# Patient Record
Sex: Male | Born: 1955
Health system: Southern US, Community
[De-identification: ages and names within clinical notes are randomized; demographics above are authoritative.]

## PROBLEM LIST (undated history)

## (undated) DIAGNOSIS — I341 Nonrheumatic mitral (valve) prolapse: Secondary | ICD-10-CM

## (undated) DIAGNOSIS — K648 Other hemorrhoids: Secondary | ICD-10-CM

## (undated) DIAGNOSIS — E7800 Pure hypercholesterolemia, unspecified: Secondary | ICD-10-CM

## (undated) DIAGNOSIS — G43909 Migraine, unspecified, not intractable, without status migrainosus: Secondary | ICD-10-CM

## (undated) DIAGNOSIS — I10 Essential (primary) hypertension: Secondary | ICD-10-CM

## (undated) DIAGNOSIS — G459 Transient cerebral ischemic attack, unspecified: Secondary | ICD-10-CM

## (undated) DIAGNOSIS — K449 Diaphragmatic hernia without obstruction or gangrene: Secondary | ICD-10-CM

## (undated) HISTORY — DX: Essential (primary) hypertension: I10

## (undated) HISTORY — DX: Pure hypercholesterolemia, unspecified: E78.00

## (undated) HISTORY — PX: COLONOSCOPY: SHX174

## (undated) HISTORY — DX: Other hemorrhoids: K64.8

## (undated) HISTORY — DX: Diaphragmatic hernia without obstruction or gangrene: K44.9

## (undated) HISTORY — DX: Nonrheumatic mitral (valve) prolapse: I34.1

## (undated) HISTORY — DX: Migraine, unspecified, not intractable, without status migrainosus: G43.909

---
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
## 2000-08-20 ENCOUNTER — Encounter: Admission: RE | Admit: 2000-08-20 | Discharge: 2000-08-20 | Payer: Self-pay | Admitting: Internal Medicine

## 2000-08-20 ENCOUNTER — Encounter: Payer: Self-pay | Admitting: Internal Medicine

## 2001-07-31 ENCOUNTER — Ambulatory Visit (HOSPITAL_COMMUNITY): Admission: RE | Admit: 2001-07-31 | Discharge: 2001-07-31 | Payer: Self-pay | Admitting: *Deleted

## 2001-07-31 ENCOUNTER — Encounter (INDEPENDENT_AMBULATORY_CARE_PROVIDER_SITE_OTHER): Payer: Self-pay | Admitting: Specialist

## 2007-06-16 ENCOUNTER — Ambulatory Visit (HOSPITAL_COMMUNITY): Admission: RE | Admit: 2007-06-16 | Discharge: 2007-06-16 | Payer: Self-pay | Admitting: *Deleted

## 2008-08-09 ENCOUNTER — Encounter: Payer: Self-pay | Admitting: Cardiovascular Disease

## 2010-02-04 DIAGNOSIS — G459 Transient cerebral ischemic attack, unspecified: Secondary | ICD-10-CM

## 2010-02-04 HISTORY — DX: Transient cerebral ischemic attack, unspecified: G45.9

## 2010-02-11 ENCOUNTER — Inpatient Hospital Stay (HOSPITAL_COMMUNITY)
Admission: AD | Admit: 2010-02-11 | Discharge: 2010-02-13 | Payer: Self-pay | Source: Home / Self Care | Attending: Internal Medicine | Admitting: Internal Medicine

## 2010-02-11 ENCOUNTER — Emergency Department (HOSPITAL_BASED_OUTPATIENT_CLINIC_OR_DEPARTMENT_OTHER)
Admission: EM | Admit: 2010-02-11 | Discharge: 2010-02-11 | Disposition: A | Discharge status: Other Acute Inpatient Hospital | Payer: Self-pay | Source: Home / Self Care | Admitting: Emergency Medicine

## 2010-02-19 LAB — DIFFERENTIAL
Basophils Absolute: 0 10*3/uL (ref 0.0–0.1)
Basophils Relative: 0 % (ref 0–1)
Eosinophils Absolute: 0.1 10*3/uL (ref 0.0–0.7)
Eosinophils Relative: 1 % (ref 0–5)
Lymphocytes Relative: 26 % (ref 12–46)
Lymphs Abs: 1.5 10*3/uL (ref 0.7–4.0)
Monocytes Absolute: 0.4 10*3/uL (ref 0.1–1.0)
Monocytes Relative: 7 % (ref 3–12)
Neutro Abs: 3.8 10*3/uL (ref 1.7–7.7)
Neutrophils Relative %: 65 % (ref 43–77)

## 2010-02-19 LAB — BASIC METABOLIC PANEL
BUN: 14 mg/dL (ref 6–23)
CO2: 25 mEq/L (ref 19–32)
Calcium: 9.1 mg/dL (ref 8.4–10.5)
Chloride: 110 mEq/L (ref 96–112)
Creatinine, Ser: 1 mg/dL (ref 0.4–1.5)
GFR calc Af Amer: >60 mL/min (ref >60)
GFR calc non Af Amer: >60 mL/min (ref >60)
Glucose, Bld: 91 mg/dL (ref 70–99)
Potassium: 3.8 mEq/L (ref 3.5–5.1)
Sodium: 142 mEq/L (ref 135–145)

## 2010-02-19 LAB — COMPREHENSIVE METABOLIC PANEL
ALT: 35 U/L (ref 0–53)
AST: 41 U/L — ABNORMAL HIGH (ref 0–37)
Albumin: 4.5 g/dL (ref 3.5–5.2)
Alkaline Phosphatase: 93 U/L (ref 39–117)
BUN: 15 mg/dL (ref 6–23)
CO2: 26 mEq/L (ref 19–32)
Calcium: 9.7 mg/dL (ref 8.4–10.5)
Chloride: 107 mEq/L (ref 96–112)
Creatinine, Ser: 1 mg/dL (ref 0.4–1.5)
GFR calc Af Amer: >60 mL/min (ref >60)
GFR calc non Af Amer: >60 mL/min (ref >60)
Glucose, Bld: 93 mg/dL (ref 70–99)
Potassium: 4.4 mEq/L (ref 3.5–5.1)
Sodium: 146 mEq/L — ABNORMAL HIGH (ref 135–145)
Total Bilirubin: 0.8 mg/dL (ref 0.3–1.2)
Total Protein: 7.7 g/dL (ref 6.0–8.3)

## 2010-02-19 LAB — LIPID PANEL
Cholesterol: 189 mg/dL (ref 0–200)
HDL: 46 mg/dL (ref >39)
LDL Cholesterol: 127 mg/dL — ABNORMAL HIGH (ref 0–99)
Total CHOL/HDL Ratio: 4.1 RATIO
Triglycerides: 78 mg/dL (ref <150)
VLDL: 16 mg/dL (ref 0–40)

## 2010-02-19 LAB — ANTIPHOSPHOLIPID SYNDROME EVAL, BLD
Anticardiolipin IgA: 3 APL U/mL — ABNORMAL LOW (ref <22)
Anticardiolipin IgG: 3 GPL U/mL — ABNORMAL LOW (ref <23)
Anticardiolipin IgM: 0 MPL U/mL — ABNORMAL LOW (ref <11)
DRVVT: 39 secs (ref 36.2–44.3)
Lupus Anticoagulant: NOT DETECTED
PTT Lupus Anticoagulant: 33.3 secs (ref 30.0–45.6)
Phosphatydalserine, IgA: <20 U/mL (ref <20)
Phosphatydalserine, IgG: <10 U/mL (ref <10)
Phosphatydalserine, IgM: <25 U/mL (ref <25)

## 2010-02-19 LAB — ANA
Anti Nuclear Antibody(ANA): NEGATIVE
Anti Nuclear Antibody(ANA): NEGATIVE
Anti Nuclear Antibody(ANA): NEGATIVE

## 2010-02-19 LAB — TSH
TSH: 1.548 u[IU]/mL (ref 0.350–4.500)
TSH: 1.94 u[IU]/mL (ref 0.350–4.500)

## 2010-02-19 LAB — CARDIAC PANEL(CRET KIN+CKTOT+MB+TROPI)
CK, MB: 0.8 ng/mL (ref 0.3–4.0)
CK, MB: 0.6 ng/mL (ref 0.3–4.0)
Relative Index: INVALID (ref 0.0–2.5)
Relative Index: INVALID (ref 0.0–2.5)
Total CK: 53 U/L (ref 7–232)
Total CK: 48 U/L (ref 7–232)
Troponin I: 0.01 ng/mL (ref 0.00–0.06)
Troponin I: 0.01 ng/mL (ref 0.00–0.06)

## 2010-02-19 LAB — CBC
HCT: 47.2 % (ref 39.0–52.0)
Hemoglobin: 16.8 g/dL (ref 13.0–17.0)
MCH: 29.4 pg (ref 26.0–34.0)
MCHC: 35.6 g/dL (ref 30.0–36.0)
MCV: 82.7 fL (ref 78.0–100.0)
Platelets: 204 10*3/uL (ref 150–400)
RBC: 5.71 MIL/uL (ref 4.22–5.81)
RDW: 13.1 % (ref 11.5–15.5)
WBC: 5.8 10*3/uL (ref 4.0–10.5)

## 2010-02-19 LAB — ACETYLCHOLINE RECEPTOR, BINDING: Acetylcholine Receptor Ab: <0.3 nmol/L (ref <=0.30)

## 2010-02-19 LAB — C4 COMPLEMENT
Complement C4, Body Fluid: 23 mg/dL (ref 16–47)
Complement C4, Body Fluid: 24 mg/dL (ref 16–47)

## 2010-02-19 LAB — D-DIMER, QUANTITATIVE: D-Dimer, Quant: 0.22 ug/mL-FEU (ref 0.00–0.48)

## 2010-02-19 LAB — ANGIOTENSIN CONVERTING ENZYME: Angiotensin-Converting Enzyme: 51 U/L (ref 8–52)

## 2010-02-19 LAB — C3 COMPLEMENT
C3 Complement: 118 mg/dL (ref 88–201)
C3 Complement: 123 mg/dL (ref 88–201)

## 2010-02-19 LAB — C-REACTIVE PROTEIN: CRP: 0.3 mg/dL — ABNORMAL LOW (ref <0.6)

## 2010-02-19 LAB — B. BURGDORFI ANTIBODIES: B burgdorferi Ab IgG+IgM: 0.24 {ISR}

## 2010-02-19 LAB — COMPLEMENT, TOTAL: Compl, Total (CH50): 54 U/mL (ref 31–60)

## 2010-02-19 LAB — SEDIMENTATION RATE: Sed Rate: 1 mm/hr (ref 0–16)

## 2010-02-19 LAB — ANTI-DNA ANTIBODY, DOUBLE-STRANDED: ds DNA Ab: 7 IU/mL (ref <30)

## 2010-02-19 LAB — PROTIME-INR
INR: 0.96 (ref 0.00–1.49)
Prothrombin Time: 13 seconds (ref 11.6–15.2)

## 2010-02-19 LAB — STRIATED MUSCLE ANTIBODY: Striated Muscle Ab: <1:40 {titer}

## 2010-02-26 LAB — ANTI-SMOOTH MUSCLE ANTIBODY, IGG: F-Actin IgG: <20

## 2010-02-28 LAB — MISCELLANEOUS TEST

## 2010-04-16 ENCOUNTER — Encounter: Payer: Self-pay | Admitting: Cardiovascular Disease

## 2010-04-16 ENCOUNTER — Telehealth (INDEPENDENT_AMBULATORY_CARE_PROVIDER_SITE_OTHER): Payer: Self-pay | Admitting: *Deleted

## 2010-04-16 ENCOUNTER — Ambulatory Visit (INDEPENDENT_AMBULATORY_CARE_PROVIDER_SITE_OTHER): Payer: Self-pay | Admitting: Cardiovascular Disease

## 2010-04-16 DIAGNOSIS — Q211 Atrial septal defect: Secondary | ICD-10-CM | POA: Insufficient documentation

## 2010-04-19 ENCOUNTER — Ambulatory Visit (HOSPITAL_COMMUNITY)
Admission: RE | Admit: 2010-04-19 | Discharge: 2010-04-19 | Disposition: A | Discharge status: Home / Self Care | Payer: BC Managed Care – PPO | Source: Ambulatory Visit | Attending: Internal Medicine | Admitting: Internal Medicine

## 2010-04-19 ENCOUNTER — Encounter: Payer: Self-pay | Admitting: Internal Medicine

## 2010-04-19 DIAGNOSIS — Q211 Atrial septal defect: Secondary | ICD-10-CM | POA: Insufficient documentation

## 2010-04-19 DIAGNOSIS — I059 Rheumatic mitral valve disease, unspecified: Secondary | ICD-10-CM | POA: Insufficient documentation

## 2010-04-19 DIAGNOSIS — Q2111 Secundum atrial septal defect: Secondary | ICD-10-CM | POA: Insufficient documentation

## 2010-04-19 DIAGNOSIS — I359 Nonrheumatic aortic valve disorder, unspecified: Secondary | ICD-10-CM | POA: Insufficient documentation

## 2010-04-23 ENCOUNTER — Telehealth: Payer: Self-pay | Admitting: Cardiovascular Disease

## 2010-04-24 ENCOUNTER — Other Ambulatory Visit: Payer: Self-pay | Admitting: Cardiovascular Disease

## 2010-04-24 ENCOUNTER — Telehealth: Payer: Self-pay | Admitting: Cardiovascular Disease

## 2010-04-24 ENCOUNTER — Encounter: Payer: Self-pay | Admitting: Cardiovascular Disease

## 2010-04-24 DIAGNOSIS — Z0489 Encounter for examination and observation for other specified reasons: Secondary | ICD-10-CM

## 2010-04-24 DIAGNOSIS — IMO0002 Reserved for concepts with insufficient information to code with codable children: Secondary | ICD-10-CM

## 2010-04-24 NOTE — Letter (Signed)
Summary: TEE Instructions  [facility], Main Office  [address]   [place], [place] [address]   Phone: [phone]  Fax: [phone]      TEE Instructions  04/16/2010 MRN: [id number]  [facility] [address] [place], [place]  [address]  [place]      You are scheduled for a TEE on Thursday April 19, 2010 with Dr. Tenny Craw.  Please arrive at the [facility] at 12:00 noon on the day of your procedure.  1)   Diet:     May have clear liquid breakfast then nothing after 7:00 am.  Clear liquids      include:  water, broth, Sprite, Ginger Ale, black cofee, tea (no sugar),        cranberry / grape / apple juice, jello (not red), popsicle from clear juices (not      red).  2)  Must have a responsible person to drive you home.  3)   Bring your current insurance cards and current list of all your medications.   *Special Note:  Every effort is made to have your procedure done on time.  Occasionally there are emergencies that present themselves at the hospital that may cause delays.  Please be patient if a delay does occur.  *If you have any questions after you get home, please call the office at [phone].

## 2010-04-24 NOTE — Assessment & Plan Note (Signed)
Summary: PFO   Visit Type:  Initial Consult Referring Provider:  Dr Pearlean Brownie Primary Provider:  Dr Renne Crigler  CC:  PFO.  History of Present Illness: This is a 55 year old gentleman referred for initial evaluation of an ASD.  The patient underwent heart surgery at age 23 - he thinks this was done to repair an ASD. He was told that he had perioral cyanosis and exercise intolerance. He reports a very active teenage and young adult life without further problems of dyspnea, exercise intolerance, or other exertional complaints.  He has had recent symptoms of transient diplopia and vertigo. He's been evaluated by Dr. Pearlean Brownie. He had double vision Jan 8th and was evaluated at [facility], where he was subsequently admitted for observation. The episode of diplopia lasted approximately 48 hours and has not recurred. A brain MRI was performed and shows nonspecific white matter hyperintensities and a transcranial Doppler bubble study was performed which showed a very large right to left shunt suggested that the intracardiac level. He was referred here for further evaluation.   He denies chest pain, dyspnea, edema, orthopnea, PND, lightheadedness, or syncope. He has rare palpitations.  Current Medications (verified): 1)  Aspirin 81 Mg Tbec (Aspirin) .... Take One Tablet By Mouth Daily 2)  Ibuprofen 200 Mg Tabs (Ibuprofen) .... As Needed 3)  Multivitamins  Tabs (Multiple Vitamin) .... One By Mouth Daily 4)  Vitamin E 400 Unit Caps (Vitamin E) .... Take One By Mouth Daily 5)  Fish Oil 1000 Mg Caps (Omega-3 Fatty Acids) .... Take One By Mouth Daily  Allergies (verified): No Known Drug Allergies  Past History:  Past medical, surgical, family and social histories (including risk factors) reviewed, and no changes noted (except as noted below).  Past Medical History: Reviewed history from 04/13/2010 and no changes required. Mitral valve prolapse Hypercholesterolemia Internal hemorrhoids Ocular  migraines  Hiatal hernia  Past Surgical History: Reviewed history from 04/13/2010 and no changes required. Heart surgery 1960- Congenital heart defect  Family History: Reviewed history from 04/13/2010 and no changes required. Mother alive - Cancer Father deceased 26- limphoma Paternal grandmother heart disease  Social History: Reviewed history from 04/13/2010 and no changes required. Emplyed [employer] Married- 2 children No tobacco use- never No alcohol use No drug use  Review of Systems       Negative except as per HPI   Vital Signs:  Patient profile:   55 year old male Weight:      192.8 pounds Pulse rate:   78 / minute Resp:     12 per minute BP sitting:   120 / 82  (right arm) Cuff size:   regular  Vitals Entered By: Julieta Gutting, RN, BSN (April 16, 2010 9:35 AM)  Physical Exam  General:  Pt is well-developed, alert and oriented, no acute distress HEENT: normal Neck: no thyromegaly           JVP normal, carotid upstrokes normal without bruits Lungs: CTA Chest: equal expansion  CV: Apical impulse nondisplaced, RRR with a short systolic ejection murmur at the LUSB Abd: soft, NT, positive BS, no HSM, no bruit Back: no CVA tenderness Ext: no clubbing, cyanosis, or edema        femoral pulses 2+ without bruits        pedal pulses 2+ and equal Skin: warm, dry, no rash Neuro: CNII-XII intact,strength 5/5 = b/l    EKG  Procedure date:  04/16/2010  Findings:      NSR with RSR'  pattern otherwise within normal limits  Impression & Recommendations:  Problem # 1:  ATRIAL SEPTAL DEFECT (ICD-745.5) The patient has evidence of a large right to left shunt based on a strongly positive transcranial Doppler study. This is in the setting of transient diplopia and concern of transient cerebral ischemia. He has a history of congenital heart disease and underwent surgery with details unclear at this time. We will request surgical records but they may be unobtainable since  his surgery was in 1960. I suspect he had repair of an atrial septal defect and with evidence of a large right-to-left shunt, and think the best way to define this would be with transesophageal echocardiography. The patient has undergone a surface echocardiogram which showed no evidence of right heart dilatation. He has no cardiac symptoms at present and his murmur is suggestive of increased flow across the pulmonic valve. Will followup with him after his transesophageal echo. Risks and indications of TEE were reviewed in detail with the patient and his wife, and they agree to proceed.  Other Orders: Trans Esophageal Echocardiogram (TEE)  Patient Instructions: 1)  Your physician recommends that you continue on your current medications as directed. Please refer to the Current Medication list given to you today. 2)  Your physician has requested that you have a TEE.  During a TEE, sound waves are used to create images of your heart. It provides your doctor with information about the size and shape of your heart and how well your heart's chambers and valves are working. In this test, a transducer is attached to the end of a flexible tube that's guided down your throat and into your esophagus (the tube leading from your mouth to your stomach) to get a more detailed image of your heart. You are not awake for the procedure. Please see the instruction sheet given to you today.  For further information please visit [email or web address]

## 2010-04-24 NOTE — Telephone Encounter (Signed)
Will await Cardiac MRI results.  Test entered in follow-up book.

## 2010-04-24 NOTE — Progress Notes (Signed)
  ROI Faxed over to [facility] @ [phone] [facility]  April 16, 2010 1:17 PM     Appended Document:  Received ROI back from [facility] " They have no records on this pt" flagged Lauren to let her know.

## 2010-05-01 ENCOUNTER — Ambulatory Visit (HOSPITAL_COMMUNITY)
Admission: RE | Admit: 2010-05-01 | Discharge: 2010-05-01 | Disposition: A | Discharge status: Home / Self Care | Payer: BC Managed Care – PPO | Source: Ambulatory Visit | Attending: Cardiovascular Disease | Admitting: Cardiovascular Disease

## 2010-05-01 ENCOUNTER — Other Ambulatory Visit: Payer: Self-pay | Admitting: Cardiovascular Disease

## 2010-05-01 DIAGNOSIS — I519 Heart disease, unspecified: Secondary | ICD-10-CM | POA: Insufficient documentation

## 2010-05-01 DIAGNOSIS — I517 Cardiomegaly: Secondary | ICD-10-CM | POA: Insufficient documentation

## 2010-05-01 DIAGNOSIS — Z0489 Encounter for examination and observation for other specified reasons: Secondary | ICD-10-CM

## 2010-05-01 DIAGNOSIS — Q211 Atrial septal defect: Secondary | ICD-10-CM | POA: Insufficient documentation

## 2010-05-01 DIAGNOSIS — Q2111 Secundum atrial septal defect: Secondary | ICD-10-CM | POA: Insufficient documentation

## 2010-05-01 DIAGNOSIS — IMO0002 Reserved for concepts with insufficient information to code with codable children: Secondary | ICD-10-CM

## 2010-05-01 LAB — BASIC METABOLIC PANEL
BUN: 8 mg/dL (ref 6–23)
CO2: 25 mEq/L (ref 19–32)
Calcium: 9.1 mg/dL (ref 8.4–10.5)
Chloride: 107 mEq/L (ref 96–112)
Creatinine, Ser: 0.85 mg/dL (ref 0.4–1.5)
GFR calc Af Amer: >60 mL/min (ref >60)
GFR calc non Af Amer: >60 mL/min (ref >60)
Glucose, Bld: 125 mg/dL — ABNORMAL HIGH (ref 70–99)
Potassium: 3.7 mEq/L (ref 3.5–5.1)
Sodium: 139 mEq/L (ref 135–145)

## 2010-05-01 MED ORDER — GADOBENATE DIMEGLUMINE 529 MG/ML IV SOLN
20.0000 mL | Freq: Once | INTRAVENOUS | Status: AC
  Administered 2010-05-01: 20 mL via INTRAVENOUS

## 2010-05-02 DIAGNOSIS — Q211 Atrial septal defect: Secondary | ICD-10-CM

## 2010-05-03 ENCOUNTER — Telehealth: Payer: Self-pay | Admitting: Cardiovascular Disease

## 2010-05-03 NOTE — Telephone Encounter (Signed)
Pt aware of Cardiac MRI results.  Appointment scheduled on 05/08/10 with Dr Excell Seltzer.

## 2010-05-03 NOTE — Telephone Encounter (Signed)
Pt rtn call to dr Excell Seltzer

## 2010-05-03 NOTE — Progress Notes (Signed)
Summary: Order Cardiac MRI  ---- Converted from flag ---- ---- 04/22/2010 10:19 AM, Norva Karvonen, MD wrote: Leotis Shames - can you set him up for a cardiac MRI to eval ASD/congenital heart disease? thx. ------------------------------  Phone Note Outgoing Call   Summary of Call: Order placed for Cardiac MRI.  I will forward this phone note to [facility] to contact the Gavin Owens with appt. Julieta Gutting, RN, BSN  April 23, 2010 12:30 PM  Follow-up for Phone Call        Gavin Owens has appt for cardiac mri on 3-27 @ 1p.m. thanks gesila ---see note in EPIC

## 2010-05-07 ENCOUNTER — Encounter: Payer: Self-pay | Admitting: Cardiovascular Disease

## 2010-05-08 ENCOUNTER — Ambulatory Visit (INDEPENDENT_AMBULATORY_CARE_PROVIDER_SITE_OTHER): Payer: BC Managed Care – PPO | Admitting: Cardiovascular Disease

## 2010-05-08 ENCOUNTER — Encounter: Payer: Self-pay | Admitting: Cardiovascular Disease

## 2010-05-08 VITALS — BP 134/78 | HR 91 | Resp 18 | Ht 70.0 in | Wt 192.1 lb

## 2010-05-08 DIAGNOSIS — Q211 Atrial septal defect: Secondary | ICD-10-CM

## 2010-05-08 NOTE — Patient Instructions (Signed)
You are scheduled for an ASD Closure on 05/15/10. Please start Plavix on 05/14/10 as instructed.    Your physician recommends that you continue on your current medications as directed. Please refer to the Current Medication list given to you today.

## 2010-05-09 ENCOUNTER — Encounter: Payer: Self-pay | Admitting: Cardiovascular Disease

## 2010-05-09 NOTE — Assessment & Plan Note (Signed)
The patient's imaging studies were carefully reviewed. I think he has appropriate anatomy for transcatheter closure of his atrial septal defect. He has clinical indication for closure with an enlarged right heart and recent neurologic event. The defect may be fenestrated and it is possible that he will require to devices. I reviewed this in detail with the patient and his wife. We also reviewed the risks, indications, and alternatives to transcatheter ASD closure. Risks include bleeding, infection, device embolization, cardiac perforation and tamponade, emergency cardiac surgery, and death. With the exception of bleeding, I advised him that all of these risks were 1% or less. He understands and agrees to proceed.

## 2010-05-09 NOTE — Progress Notes (Signed)
HPI:  This is a 55 year old gentleman returning for followup evaluation. The patient had a TIA and as part of his evaluation he underwent a transcranial Doppler study showing a large right to left intracardiac shunt. The patient has a history of remote cardiac surgery in 1960. We have been unable to find the details of the surgery as operative reports were not available. The patient remembers that he was cyanotic as a 87-year-old but he doesn't know any details of his surgery.  A TEE was performed and this demonstrated a secundum ASD with left to right flow. The defect appeared fenestrated with 2 holes in close proximity to each other in the secundum septum. He also had mild right heart dilatation. There were no other abnormalities noted. I then referred him for a cardiac MRI to make sure that there was no other structural heart disease that we were missing. The patient's MRI also showed a secundum ASD with enlarged right heart, but no other evidence of cardiac anomalies. All 4 pulmonary veins were identified as emptying into the left atrium.  The patient presents for followup discussion of our treatment plan. He reports no neurologic symptoms since his last evaluation. He denies chest pain, shortness of breath, edema, palpitations, or syncope.  Outpatient Encounter Prescriptions as of 05/08/2010  Medication Sig Dispense Refill  . aspirin 81 MG tablet Take 81 mg by mouth daily.        . fish oil-omega-3 fatty acids 1000 MG capsule Take 1 g by mouth daily.       [name] ibuprofen (ADVIL,MOTRIN) 200 MG tablet Take 200 mg by mouth every 6 (six) hours as needed.        . Multiple Vitamin (MULTIVITAMIN) tablet Take 1 tablet by mouth daily.        . vitamin E 400 UNIT capsule Take 400 Units by mouth daily.          No Known Allergies  Past Medical History  Diagnosis Date  . MVP (mitral valve prolapse)   . Hypercholesteremia   . Internal hemorrhoids   . Migraines     Ocular  . Hiatal hernia     ROS:  Negative except as per HPI  BP 134/78  Pulse 91  Resp 18  Ht 5\' 10"  (1.778 m)  Wt 192 lb 1.9 oz (87.145 kg)  BMI 27.57 kg/m2  ASSESSMENT AND PLAN:

## 2010-05-15 ENCOUNTER — Ambulatory Visit (HOSPITAL_COMMUNITY): Payer: BC Managed Care – PPO

## 2010-05-15 ENCOUNTER — Observation Stay (HOSPITAL_COMMUNITY)
Admission: RE | Admit: 2010-05-15 | Discharge: 2010-05-16 | Disposition: A | Discharge status: Home / Self Care | Payer: BC Managed Care – PPO | Source: Ambulatory Visit | Attending: Cardiovascular Disease | Admitting: Cardiovascular Disease

## 2010-05-15 DIAGNOSIS — Q2111 Secundum atrial septal defect: Secondary | ICD-10-CM

## 2010-05-15 DIAGNOSIS — Q211 Atrial septal defect: Secondary | ICD-10-CM

## 2010-05-15 DIAGNOSIS — Z01810 Encounter for preprocedural cardiovascular examination: Secondary | ICD-10-CM | POA: Insufficient documentation

## 2010-05-15 DIAGNOSIS — Z8673 Personal history of transient ischemic attack (TIA), and cerebral infarction without residual deficits: Secondary | ICD-10-CM | POA: Insufficient documentation

## 2010-05-15 DIAGNOSIS — Z79899 Other long term (current) drug therapy: Secondary | ICD-10-CM | POA: Insufficient documentation

## 2010-05-15 DIAGNOSIS — Z01812 Encounter for preprocedural laboratory examination: Secondary | ICD-10-CM | POA: Insufficient documentation

## 2010-05-15 LAB — BASIC METABOLIC PANEL
BUN: 7 mg/dL (ref 6–23)
CO2: 30 mEq/L (ref 19–32)
Calcium: 9.3 mg/dL (ref 8.4–10.5)
Chloride: 106 mEq/L (ref 96–112)
Creatinine, Ser: 0.92 mg/dL (ref 0.4–1.5)
GFR calc Af Amer: >60 mL/min (ref >60)
GFR calc non Af Amer: >60 mL/min (ref >60)
Glucose, Bld: 94 mg/dL (ref 70–99)
Potassium: 3.4 mEq/L — ABNORMAL LOW (ref 3.5–5.1)
Sodium: 142 mEq/L (ref 135–145)

## 2010-05-15 LAB — POCT ACTIVATED CLOTTING TIME
Activated Clotting Time: 116 seconds
Activated Clotting Time: 122 seconds
Activated Clotting Time: 228 seconds
Activated Clotting Time: 234 seconds
Activated Clotting Time: 169 seconds

## 2010-05-15 LAB — CBC
HCT: 47.4 % (ref 39.0–52.0)
Hemoglobin: 16.6 g/dL (ref 13.0–17.0)
MCH: 30.3 pg (ref 26.0–34.0)
MCHC: 35 g/dL (ref 30.0–36.0)
MCV: 86.7 fL (ref 78.0–100.0)
Platelets: 186 10*3/uL (ref 150–400)
RBC: 5.47 MIL/uL (ref 4.22–5.81)
RDW: 12.8 % (ref 11.5–15.5)
WBC: 7.1 10*3/uL (ref 4.0–10.5)

## 2010-05-15 LAB — PROTIME-INR
INR: 0.91 (ref 0.00–1.49)
Prothrombin Time: 12.5 seconds (ref 11.6–15.2)

## 2010-05-16 ENCOUNTER — Observation Stay (HOSPITAL_COMMUNITY): Payer: BC Managed Care – PPO

## 2010-05-16 DIAGNOSIS — Q211 Atrial septal defect: Secondary | ICD-10-CM

## 2010-05-25 ENCOUNTER — Telehealth: Payer: Self-pay | Admitting: Cardiovascular Disease

## 2010-05-25 NOTE — Telephone Encounter (Signed)
Pt had ASD Closure performed on 05/15/10. I spoke with the pt and he would like to mow his yard using riding lawnmower.  The pt's yard is not "bumpy" and the ride would be smooth.  I told the pt that this would be okay.  The pt was advised that he cannot use weed eater or push mower at this time.  The pt said he would have his son do this part of the yard work.

## 2010-05-25 NOTE — Telephone Encounter (Signed)
Pt wants to know if he can cut his grass. Pt states to take it easy for a month.

## 2010-06-04 ENCOUNTER — Ambulatory Visit (INDEPENDENT_AMBULATORY_CARE_PROVIDER_SITE_OTHER): Payer: BC Managed Care – PPO | Admitting: Cardiovascular Disease

## 2010-06-04 VITALS — BP 118/76 | HR 84 | Ht 70.0 in | Wt 191.0 lb

## 2010-06-04 DIAGNOSIS — Q211 Atrial septal defect: Secondary | ICD-10-CM

## 2010-06-04 NOTE — Assessment & Plan Note (Signed)
The patient is stable following transcatheter closure of his atrial septal defect. He will continue on dual antiplatelet therapy with aspirin and Plavix. The patient will have a six-month followup echo and office visit at that time. He was advised to continue with SBE prophylaxis for a period of 6 months as well. I asked him to contact our office if he has further palpitations or to seek immediate medical attention if he has sustained palpitations with associated symptoms. We discussed the fact that he will be at risk for supraventricular arrhythmias even after repair of his ASD. We also discussed the in the event of chest pain or shortness of breath he needs to seek immediate attention as there are rare, late device related events they can occur.

## 2010-06-04 NOTE — Progress Notes (Signed)
HPI:  This is a 55 year old gentleman who presented with transient diplopia in January 2012. He was thought to have had a TIA. He followed up with a transcranial Doppler study showing a marked right to left shunt. TEE demonstrated a secundum ASD with at least 2 holes. The patient underwent successful transcatheter closure with a single atrial septal occluder device May 15, 2010. He presents today for followup evaluation. He's had a single episode of "heart racing." He denies associated symptoms. The episode lasted several minutes. He denies chest pain, dyspnea, edema, or lightheadedness. He otherwise feels well. He has had episodic palpitations in the past but this episode was more prolonged.  Outpatient Encounter Prescriptions as of 06/04/2010  Medication Sig Dispense Refill  . aspirin 81 MG tablet Take 81 mg by mouth daily.        . clopidogrel (PLAVIX) 75 MG tablet Take 75 mg by mouth daily.        . fish oil-omega-3 fatty acids 1000 MG capsule Take 1 g by mouth daily.       [name] ibuprofen (ADVIL,MOTRIN) 200 MG tablet Take 200 mg by mouth every 6 (six) hours as needed.        . Multiple Vitamin (MULTIVITAMIN) tablet Take 1 tablet by mouth daily.        . vitamin E 400 UNIT capsule Take 400 Units by mouth daily.          No Known Allergies  Past Medical History  Diagnosis Date  . MVP (mitral valve prolapse)   . Hypercholesteremia   . Internal hemorrhoids   . Migraines     Ocular  . Hiatal hernia     ROS: Negative except as per HPI  BP 118/76  Pulse 84  Ht 5\' 10"  (1.778 m)  Wt 191 lb (86.637 kg)  BMI 27.41 kg/m2  PHYSICAL EXAM: Pt is alert and oriented, NAD HEENT: normal Neck: JVP - normal, carotids 2+= without bruits Lungs: CTA bilaterally CV: RRR with a soft systolic ejection murmur at the left upper sternal border. Abd: soft, NT, Positive BS, no hepatomegaly Ext: no C/C/E, distal pulses intact and equal Skin: warm/dry no rash  EKG: Normal sinus rhythm 84 beats per minute,  incomplete right bundle branch block with QRS duration 104 ms, first degree AV block with PR interval 208 ms.  ASSESSMENT AND PLAN:

## 2010-06-04 NOTE — Patient Instructions (Signed)
Your physician has requested that you have an echocardiogram in October. Echocardiography is a painless test that uses sound waves to create images of your heart. It provides your doctor with information about the size and shape of your heart and how well your heart's chambers and valves are working. This procedure takes approximately one hour. There are no restrictions for this procedure.  Your physician recommends that you schedule a follow-up appointment in: October with Dr Excell Seltzer  Your physician recommends that you continue on your current medications as directed. Please refer to the Current Medication list given to you today.

## 2010-06-07 NOTE — Discharge Summary (Signed)
  Gavin Owens, Gavin Owens                ACCOUNT NO.:  [id number]  MEDICAL RECORD NO.:  [id number]           PATIENT TYPE:  O  LOCATION:  6523                         FACILITY:  [facility]  PHYSICIAN:  Veverly Fells. Excell Seltzer, MD  DATE OF BIRTH:  01/04/1956  DATE OF ADMISSION:  05/15/2010 DATE OF DISCHARGE:  05/16/2010                              DISCHARGE SUMMARY   FINAL DIAGNOSIS:  Atrial septal defect.  SECONDARY DIAGNOSIS:  History of transient ischemic attack.  HOSPITAL COURSE:  [name] is a 55 year old gentleman with ostium secundum ASD.  He presented a few months back with a TIA and subsequently had a transcranial Doppler that demonstrated a large right- to-left intracardiac shunts.  A TEE demonstrated an atrial septal defect with apparently with two separate defects in close proximity.  After review, we elected to proceed with transcatheter closure.  On May 15, 2010, the patient underwent transcatheter ASD closure with a 12-mm atrial septal occluder device under guidance of intracardiac echo.  He tolerated the procedure well and there were no procedural complications.  There was an excellent result with complete defect closure.  The following morning, he underwent a chest x-ray that demonstrated appropriate device position.  His electrocardiogram was unchanged from baseline.  His 2-D echo was performed, and the result is currently pending.  The patient is free of symptoms and otherwise, ready for discharge pending the final result of his echocardiogram.  DISCHARGE MEDICATIONS:  Please see the medication reconciliation list for full list of the patient's medications.  His antiplatelet therapy will be aspirin 81 mg and Plavix 75 mg for a period of 3-6 months.  FOLLOWUP INSTRUCTIONS:  The patient will be seen back in approximately 1 month for followup.  He was advised to continue on aspirin and Plavix without interruption.  He was advised to use SBE prophylaxis for a period of 6  months.  The patient was instructed that he can resume normal daily activities but should avoid strenuous lifting for a period of 30 days.  DISCHARGE CONDITION:  Stable.  Again, the patient's 2-D echocardiogram will be reviewed prior to discharge and as long as this shows appropriate device position and no pericardial effusion, he will be discharged home.     Veverly Fells. Excell Seltzer, MD     MDC/MEDQ  D:  05/16/2010  T:  05/16/2010  Job:  [id number]  cc:   Pramod P. Pearlean Brownie, MD Soyla Murphy Renne Crigler, M.D.  Electronically Signed by Tonny Bollman MD on 06/07/2010 10:34:40 AM

## 2010-06-07 NOTE — Cardiovascular Report (Signed)
NAMEKESHAUN, DUBEY                ACCOUNT NO.:  [id number]  MEDICAL RECORD NO.:  [id number]           PATIENT TYPE:  O  LOCATION:  6523                         FACILITY:  [facility]  PHYSICIAN:  Veverly Fells. Excell Seltzer, MD  DATE OF BIRTH:  09-Jun-1955  DATE OF PROCEDURE:  05/15/2010 DATE OF DISCHARGE:  05/16/2010                           CARDIAC CATHETERIZATION   PROCEDURE: 1. Intracardiac echocardiography. 2. Transcatheter closure of an ostium secundum atrial septal defect.  PROCEDURAL INDICATIONS:  Mr. Gavin Owens is a 55 year old gentleman who presented earlier this year with a transient ischemic attack.  He underwent a transcranial Doppler showing large right to left intracardiac shunt.  The patient had a history of remote cardiac surgery at age 65.  A transesophageal echocardiogram showed a fenestrated ostium secundum ASD.  A cardiac MRI showed no other congenital anomalies.  The patient was referred for transcatheter ASD closure.  Risks and indication of the procedure were reviewed with the patient. Informed consent was obtained.  The right groin was prepped, draped, and anesthetized with 1% lidocaine.  The patient was sedated with Versed and fentanyl.  He was given intravenous Ancef just before the procedure.  He had been adequately preloaded with aspirin and Plavix.  Venous access was obtained in the right femoral vein with a 8-French and 9-French sheath.  Once access was obtained, the patient was given 5000 units of fractionated heparin.  His initial ACT came back at less than 150, and he was given an additional 4000 units of unfractionated heparin.  This brought his ACT to greater than 200.  An intracardiac echo probe was introduced through the 9-French sheath.  An intracardiac echocardiography was performed.  This confirmed an ostium secundum ASD. There was one interatrial communication that was well seen.  It was difficult to visualize the second hole.  An angled Glidewire was  passed across the interatrial septum into the left upper pulmonary vein, and a multipurpose catheter was passed across the septum.  This was then changed out for a Amplatz superstiff wire.  A 24-mm sizing balloon was advanced across the defect and was inflated under guidance of intracardiac echo until we achieved "stop-flow."  This demonstrated that the wire was actually across the smaller defect, and there was still significant left-to-right flow against the larger defect.  The sizing balloon was deflated and pulled back into the right heart.  This was changed back out for a short sheath, and the inner atrial septum was again crossed with the with the Glidewire, but this time the wire was directed under intracardiac echo guidance across the bigger defect.  The wire was again changed out through multipurpose catheter, this time in the left lower pulmonary vein, so that an Amplatz superstiff wire was re- advanced.  The sizing balloon was advanced back in and was inflated until we achieved stop-flow under intracardiac echo.  The diameter of the defect was then carefully measured under fluoroscopy, and it measured 10 mm.  I felt that the second hole was close enough that we would achieve complete coverage once the device was deployed and felt that a single device would likely cover  both holes.  A 10-mm atrial septal occluder was prepped on the table and was advanced under normal technique through an 8-French delivery sheath.  The sheath was positioned at the level of the left upper pulmonary vein and then was pulled back into the left atrium.  Unfortunately, the device when advanced out of the sheath took a deformed figure, and it was not appropriate for deployment, we attempted several times, but the device configuration was not appropriate, and the device was pulled back out of the sheath and reprepped on the table.  It was again advanced, but it continued to take the same configuration.   At that point, I elected to deploy a 12-mm device.  The 12-mm atrial septal occluder was prepped again under normal technique and was advanced through the delivery sheath.  This device deployed appropriately in the left atrium.  The left atrial disk was pulled back against the interatrial septum, and the right atrial disk was deployed.  It took an appropriate configuration. Intracardiac echo was utilized to evaluate if there was any residual color-flow.  There was mild flow seen through the center of the device, which is often the case.  The defect appeared to be completely closed, and there was no flow seen through the second defect.  Fluoroscopy demonstrated appropriate positioning, and the device cable was released. There was an excellent result.  The long delivery sheath was then changed out for an 8-French short sheath, and a agitated saline study was done under guidance of intracardiac echo.  There were no bubbles that crossed from right to left.  The patient tolerated the procedure well.  There were no immediate complications.  He was transferred to the recovery area in stable condition.  FINAL CONCLUSION:  Successful transcatheter closure of a multi- fenestrated atrial septal defect using a 12-mm atrial septal occluder device.  RECOMMENDATIONS:  The patient will continue on dual antiplatelet therapy with aspirin and Plavix.  He will have a followup chest x-ray and echocardiogram in the morning.     Veverly Fells. Excell Seltzer, MD     MDC/MEDQ  D:  05/16/2010  T:  05/16/2010  Job:  [id number]  cc:   Pramod P. Pearlean Brownie, MD Soyla Murphy Renne Crigler, M.D.  Electronically Signed by Tonny Bollman MD on 06/07/2010 10:34:43 AM

## 2010-06-19 NOTE — Op Note (Signed)
Gavin Owens, Gavin Owens NO.:  [id number]   MEDICAL RECORD NO.:  [id number]          PATIENT TYPE:  AMB   LOCATION:  ENDO                         FACILITY:  [facility]   PHYSICIAN:  Georgiana Spinner, M.D.    DATE OF BIRTH:  1955-09-24   DATE OF PROCEDURE:  DATE OF DISCHARGE:                               OPERATIVE REPORT   PROCEDURE:  Colonoscopy   INDICATIONS:  Colon cancer screening.   ANESTHESIA:  Fentanyl 100 mcg, Versed 8 mg.   PROCEDURE:  With the patient mildly sedated in the left lateral  decubitus position, a rectal exam was performed which was unremarkable.  Subsequently the Pentax videoscopic colonoscope was inserted in the  rectum and passed under direct vision to the sigmoid colon at which  point the sigmoid turned fairly acutely and I could not get this scope  to pass very far without causing discomfort so I withdrew this  colonoscope and inserted a pediatric colonoscope and inserted it through  this area and subsequently under direct vision to the cecum identified  by the ileocecal valve and appendiceal orifice both of which were  photographed. From this point, the colonoscope was slowly withdrawn  taking circumferential views of the colonic mucosa stopping in the  rectum which appeared normal on direct and showed hemorrhoids on  retroflexed view. The endoscope was straightened and withdrawn.  The  patient's vital signs and pulse oximeter remained stable.  The patient  tolerated the procedure well without apparent complication.   FINDINGS:  Internal hemorrhoids with tight turn of the sigmoid colon  otherwise an unremarkable exam.   PLAN:  Have patient follow-up with me in 5-10 years or as needed.           ______________________________  Georgiana Spinner, M.D.     GMO/MEDQ  D:  06/16/2007  T:  06/16/2007  Job:  [id number]

## 2010-06-22 NOTE — Procedures (Signed)
[facility]  Patient:    Gavin Owens, Gavin Owens Visit Number: [id number] MRN: [id number]          Service Type: END Location: ENDO Attending Physician:  Sabino Gasser Dictated by:   Sabino Gasser, M.D. Proc. Date: 07/31/01 Admit Date:  07/31/2001 Discharge Date: 07/31/2001                             Procedure Report  PROCEDURE:  Upper endoscopy.  INDICATION:  Gastroesophageal reflux disease.  ANESTHESIA:  Demerol 50, Versed 6 mg.  DESCRIPTION OF PROCEDURE:  With patient mildly sedated in the left lateral decubitus position, the Olympus videoscopic endoscope was inserted into the mouth and passed under direct vision through the esophagus, into the stomach. Fundus, body, antrum, were visualized as was duodenal bulb and second portion of duodenum.  From this point, the endoscope was slowly withdrawn, taking circumferential views of the entire duodenal mucosa until the endoscope then pulled back into the stomach and placed in retroflexion to view the stomach from below, and the scope was then straightened and withdrawn, taking circumferential views of the remaining gastric and esophageal mucosa, stopping in the antrum where erythema was seen in the antrum which was photographed and biopsied.  We withdrew to the distal esophagus above a hiatal hernia which was photographed.  There was no clear evidence of Barretts esophagus.  One small area was slightly prominent but with Z-line seemed to come cephalad, and this was biopsied to rule out Barretts esophagus.  The endoscope was then withdrawn, taking circumferential of the remaining esophageal mucosa.  The patients vital signs and pulse oximeter remained stable.  The patient tolerated the procedure well without apparent complications.  FINDINGS:  Hiatal hernia, otherwise unremarkable examination.  PLAN:  Await biopsy reports.  The patient will call me for results and follow up with me as an outpatient. Dictated  by:   Sabino Gasser, M.D. Attending Physician:  Sabino Gasser DD:  07/31/01 TD:  08/02/01 Job: [id number]

## 2010-10-24 ENCOUNTER — Encounter: Payer: Self-pay | Admitting: *Deleted

## 2010-10-30 ENCOUNTER — Ambulatory Visit (INDEPENDENT_AMBULATORY_CARE_PROVIDER_SITE_OTHER): Payer: PRIVATE HEALTH INSURANCE | Admitting: Cardiovascular Disease

## 2010-10-30 ENCOUNTER — Encounter: Payer: Self-pay | Admitting: Cardiovascular Disease

## 2010-10-30 ENCOUNTER — Ambulatory Visit (HOSPITAL_COMMUNITY): Payer: PRIVATE HEALTH INSURANCE | Attending: Cardiology | Admitting: Radiology

## 2010-10-30 VITALS — BP 126/78 | HR 71 | Resp 18 | Ht 70.0 in | Wt 195.0 lb

## 2010-10-30 DIAGNOSIS — Q2111 Secundum atrial septal defect: Secondary | ICD-10-CM | POA: Insufficient documentation

## 2010-10-30 DIAGNOSIS — Q211 Atrial septal defect: Secondary | ICD-10-CM

## 2010-10-30 NOTE — Progress Notes (Signed)
HPI:  This is a 55 year old gentleman presented for followup evaluation. The patient had a TIA earlier this year and underwent evaluation demonstrating a right to left intracardiac shunt. A transesophageal echocardiogram showed small multi-fenestrated secundum ASD. The patient underwent transcatheter closure in April 2012. He presents for followup today.  He feels well he has had no recurrent stroke or TIA symptoms. He specifically denies vision changes, numbness tingling weakness of his extremities, speech problems, or amaurosis. He has no chest pain, dyspnea, or palpitations. He continues on dual antiplatelet therapy with aspirin and Plavix.  Outpatient Encounter Prescriptions as of 10/30/2010  Medication Sig Dispense Refill  . aspirin 81 MG tablet Take 81 mg by mouth daily.        . clopidogrel (PLAVIX) 75 MG tablet Take 75 mg by mouth daily.        . fish oil-omega-3 fatty acids 1000 MG capsule Take 1 g by mouth daily.       [name] ibuprofen (ADVIL,MOTRIN) 200 MG tablet Take 200 mg by mouth every 6 (six) hours as needed.        . Multiple Vitamin (MULTIVITAMIN) tablet Take 1 tablet by mouth daily.        . vitamin E 400 UNIT capsule Take 400 Units by mouth daily.          No Known Allergies  Past Medical History  Diagnosis Date  . MVP (mitral valve prolapse)   . Hypercholesteremia   . Internal hemorrhoids   . Migraines     Ocular  . Hiatal hernia     ROS: Negative except as per HPI  BP 126/78  Pulse 71  Resp 18  Ht 5\' 10"  (1.778 m)  Wt 195 lb (88.451 kg)  BMI 27.98 kg/m2  PHYSICAL EXAM: Pt is alert and oriented, NAD HEENT: normal Neck: JVP - normal, carotids 2+= without bruits Lungs: CTA bilaterally CV: RRR without murmur or gallop Abd: soft, NT, Positive BS, no hepatomegaly Ext: no C/C/E, distal pulses intact and equal Skin: warm/dry no rash  EKG:  Normal sinus rhythm with incomplete right bundle branch block, heart rate 71 beats per minute.  ASSESSMENT AND PLAN:

## 2010-10-30 NOTE — Assessment & Plan Note (Signed)
The patient had an echocardiogram performed today. I reviewed these images and it demonstrates appropriate position of his atrial septal occluder device. There appears to be no residual shunt by color-flow Doppler. The patient is now about 6 months out from his procedure and have recommended that he can discontinue Plavix once he runs out of his current bottle. He should remain on antiplatelet therapy with aspirin. I would like to see him back in 12 months for followup and a repeat echo.

## 2010-10-30 NOTE — Patient Instructions (Signed)
Your physician has recommended you make the following change in your medication: You can Stop Plavix when you finish your current bottle  Your physician has requested that you have an echocardiogram in 1 YEAR. [name] Echocardiography is a painless test that uses sound waves to create images of your heart. It provides your doctor with information about the size and shape of your heart and how well your heart's chambers and valves are working. This procedure takes approximately one hour. There are no restrictions for this procedure.  Your physician wants you to follow-up in: 1 YEAR.  You will receive a reminder letter in the mail two months in advance. If you don't receive a letter, please call our office to schedule the follow-up appointment.

## 2011-11-28 ENCOUNTER — Ambulatory Visit: Payer: PRIVATE HEALTH INSURANCE | Admitting: Cardiovascular Disease

## 2011-12-02 ENCOUNTER — Ambulatory Visit (INDEPENDENT_AMBULATORY_CARE_PROVIDER_SITE_OTHER): Payer: Managed Care, Other (non HMO) | Admitting: Cardiovascular Disease

## 2011-12-02 ENCOUNTER — Encounter: Payer: Self-pay | Admitting: Cardiovascular Disease

## 2011-12-02 VITALS — BP 118/74 | HR 96 | Ht 70.0 in | Wt 194.8 lb

## 2011-12-02 DIAGNOSIS — Q2111 Secundum atrial septal defect: Secondary | ICD-10-CM

## 2011-12-02 DIAGNOSIS — Q211 Atrial septal defect: Secondary | ICD-10-CM

## 2011-12-02 NOTE — Progress Notes (Signed)
   HPI:  56 year old gentleman presenting for followup evaluation. The patient has a history of TIA and he was subsequently diagnosed with a multi-fenestrated ASD. He underwent transcatheter closure in April 2012 and presents today for followup evaluation.  The patient feels well at the present time. He walks about 1/2 miles at least a few times per week. He has no exertional symptoms. He specifically denies chest pain, chest pressure, dyspnea, edema, orthopnea, or PND. He has some palpitations with caffeine, but otherwise is has not been a problem.  Outpatient Encounter Prescriptions as of 12/02/2011  Medication Sig Dispense Refill  . aspirin 81 MG tablet Take 81 mg by mouth daily.        . fish oil-omega-3 fatty acids 1000 MG capsule Take 1 g by mouth daily.       [name] ibuprofen (ADVIL,MOTRIN) 200 MG tablet Take 200 mg by mouth every 6 (six) hours as needed.        . Multiple Vitamin (MULTIVITAMIN) tablet Take 1 tablet by mouth daily.        . vitamin E 400 UNIT capsule Take 400 Units by mouth every other day.       [name] DISCONTD: clopidogrel (PLAVIX) 75 MG tablet Take 75 mg by mouth daily.          No Known Allergies  Past Medical History  Diagnosis Date  . MVP (mitral valve prolapse)   . Hypercholesteremia   . Internal hemorrhoids   . Migraines     Ocular  . Hiatal hernia     ROS: Negative except as per HPI  BP 118/74  Pulse 96  Ht 5\' 10"  (1.778 m)  Wt 88.361 kg (194 lb 12.8 oz)  BMI 27.95 kg/m2  SpO2 95%  PHYSICAL EXAM: Pt is alert and oriented, NAD HEENT: normal Neck: JVP - normal, carotids 2+= without bruits Lungs: CTA bilaterally CV: RRR, there is a tamboric P2 sound with a grade 2/6 early systolic murmur at the left upper sternal border Abd: soft, NT, Positive BS, no hepatomegaly Ext: no C/C/E, distal pulses intact and equal Skin: warm/dry no rash  ASSESSMENT AND PLAN: Ostium secundum ASD status post transcatheter closure. The patient appears stable with no  cardiopulmonary symptoms at present. I think he should have a followup echocardiogram and he would like to wait until January for insurance reasons. Will check an EKG and echo for surveillance of his ASD status post closure. He will followup in 12 months.  Tonny Bollman 12/02/2011 2:26 PM

## 2011-12-02 NOTE — Patient Instructions (Signed)
Your physician has requested that you have an echocardiogram in January 2014. Echocardiography is a painless test that uses sound waves to create images of your heart. It provides your doctor with information about the size and shape of your heart and how well your heart's chambers and valves are working. This procedure takes approximately one hour. There are no restrictions for this procedure.  Your physician recommends that you schedule a NURSE VISIT for an EKG in January 2014.  Your physician wants you to follow-up in: 1 YEAR with Dr Excell Seltzer. You will receive a reminder letter in the mail two months in advance. If you don't receive a letter, please call our office to schedule the follow-up appointment.  Your physician recommends that you continue on your current medications as directed. Please refer to the Current Medication list given to you today.

## 2011-12-03 ENCOUNTER — Encounter: Payer: Self-pay | Admitting: Cardiovascular Disease

## 2011-12-03 NOTE — Telephone Encounter (Signed)
New Problem:    Patient's wife called in wanting to know want the CPT code for her husbands ECHO would be.   Please call back.

## 2011-12-03 NOTE — Telephone Encounter (Signed)
This encounter was created in error - please disregard.

## 2011-12-05 IMAGING — CT CT HEAD W/O CM
1 series · 16 of 30 positions shown, 20 images · non-contrast
Comparison: None.

CLINICAL DATA: Double vision.

CT HEAD WITHOUT CONTRAST
TECHNIQUE: Contiguous axial images were obtained from the base of
the skull through the vertex without contrast.

[Series 2: head 4.8 h37s · axial · 0.46mm/px · z∈[+1318,+1455]mm · 16 of 32 slices shown, 20 images]
[im 2/32  brain]
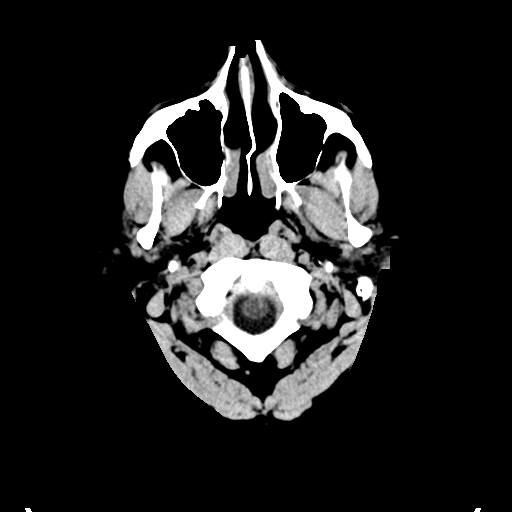
[im 2/32  bone]
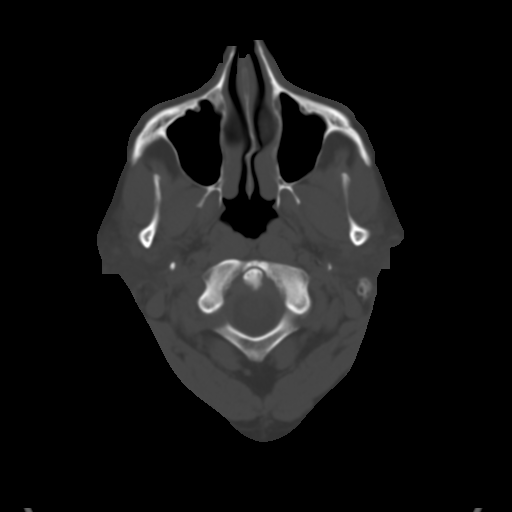
[im 4/32  brain]
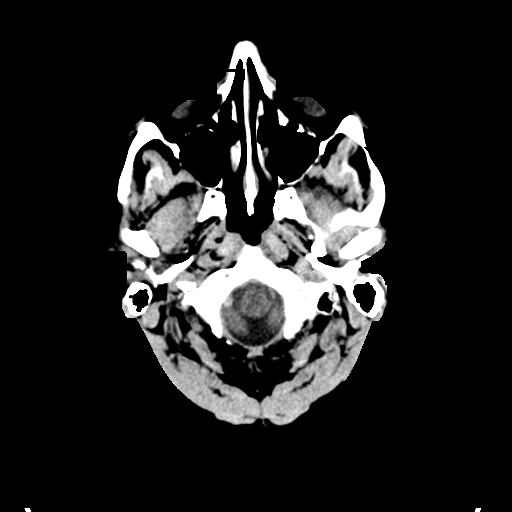
[im 6/32  brain]
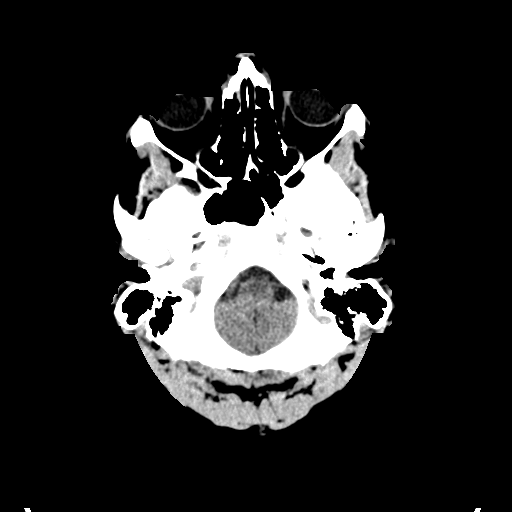
[im 8/32  brain]
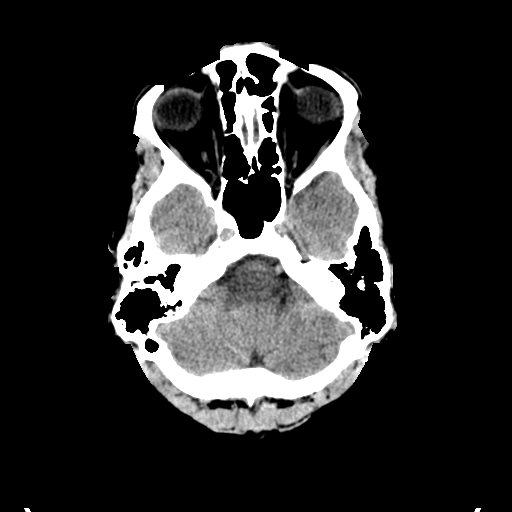
[im 9/32  brain]
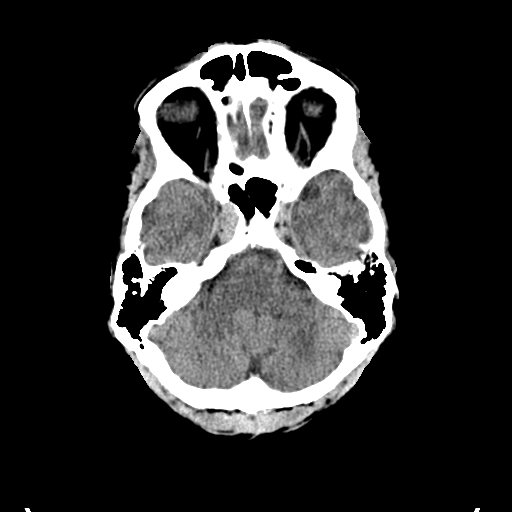
[im 9/32  bone]
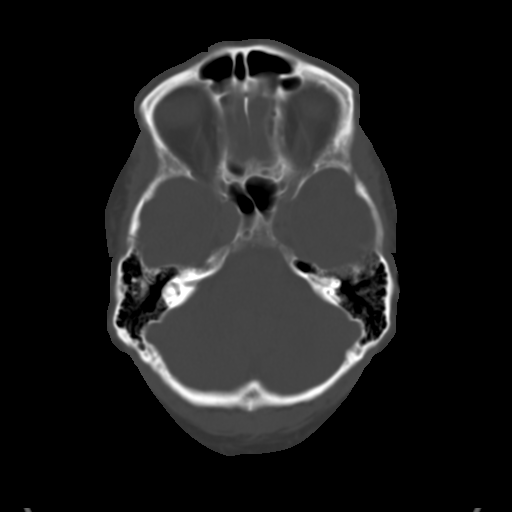
[im 11/32  brain]
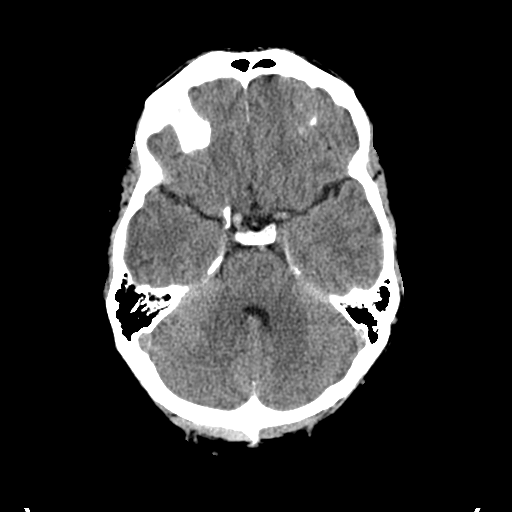
[im 13/32  brain]
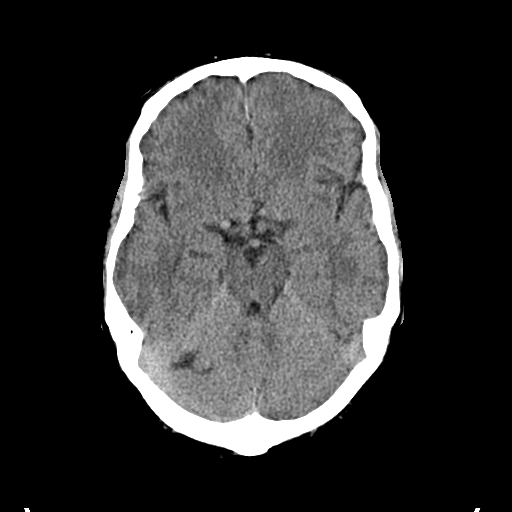
[im 15/32  brain]
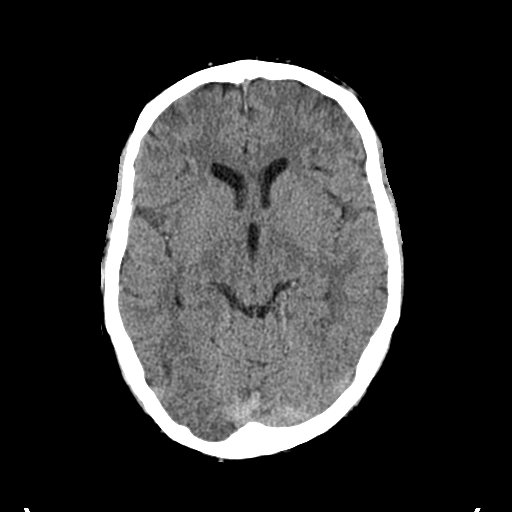
[im 17/32  brain]
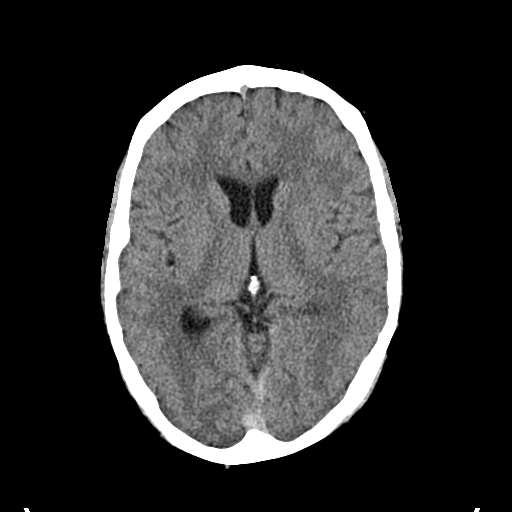
[im 17/32  bone]
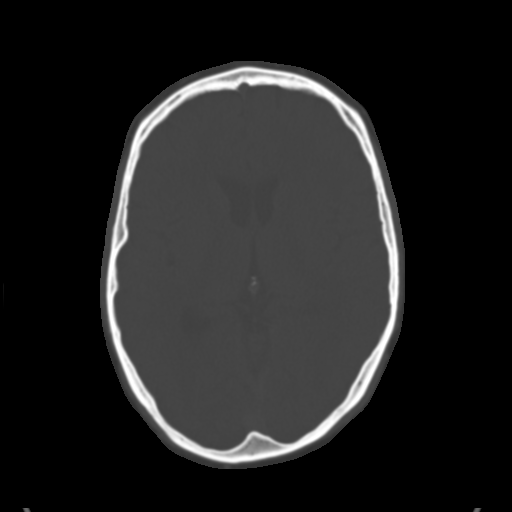
[im 19/32  brain]
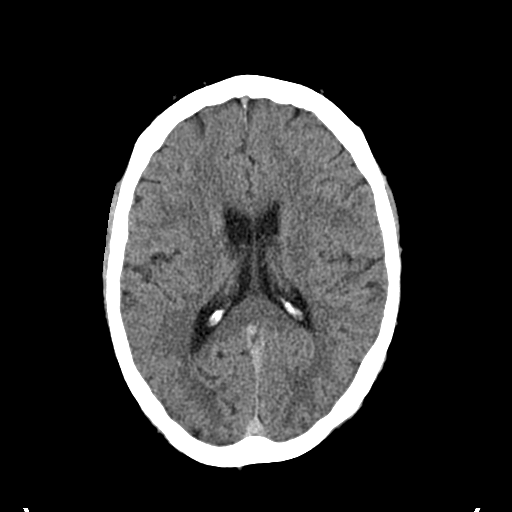
[im 21/32  brain]
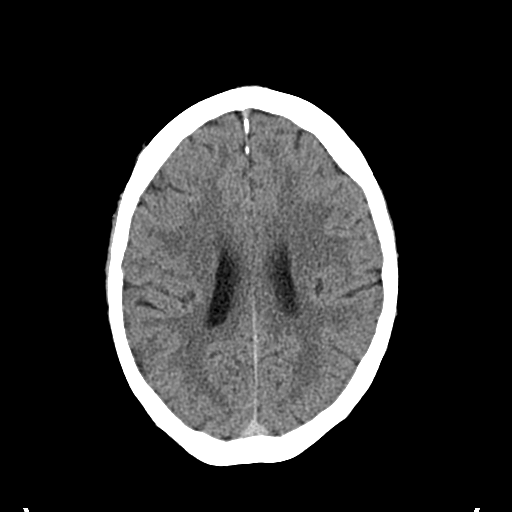
[im 23/32  brain]
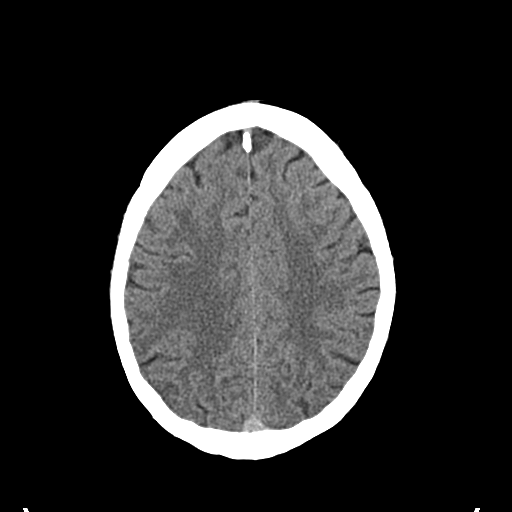
[im 24/32  brain]
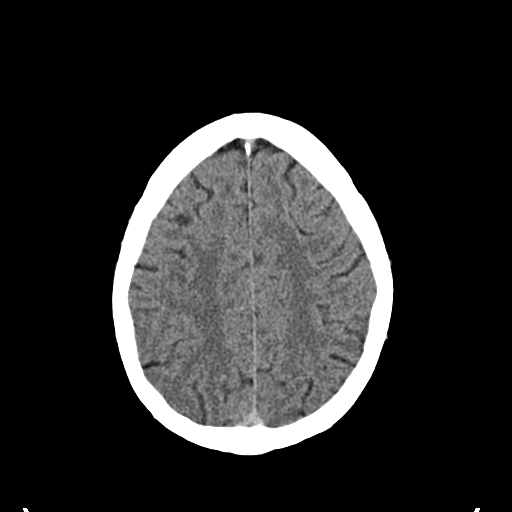
[im 24/32  bone]
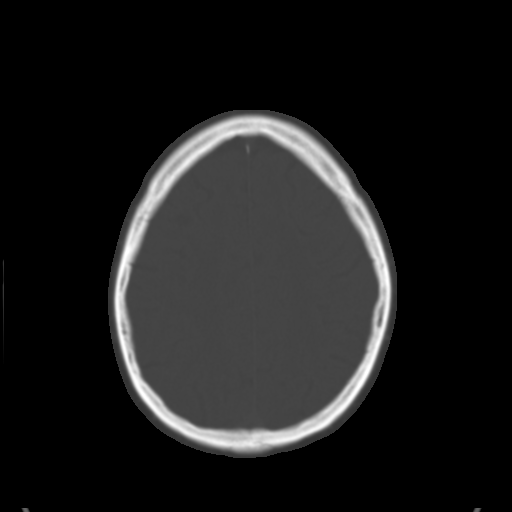
[im 26/32  brain]
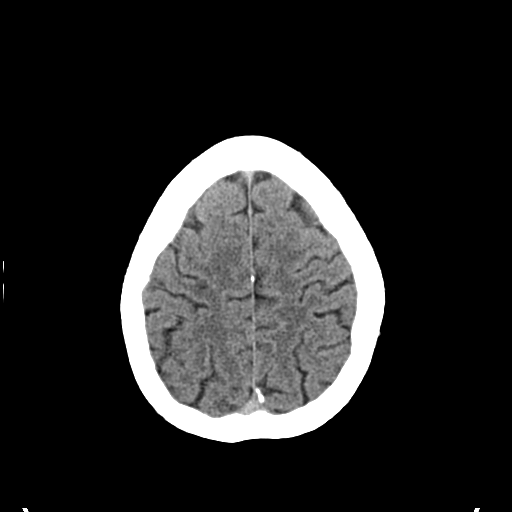
[im 28/32  brain]
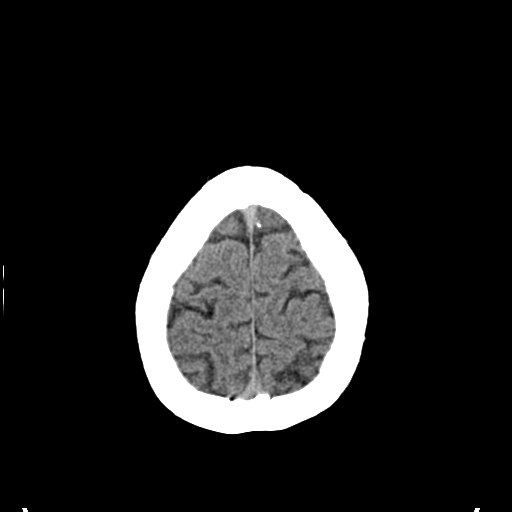
[im 30/32  brain]
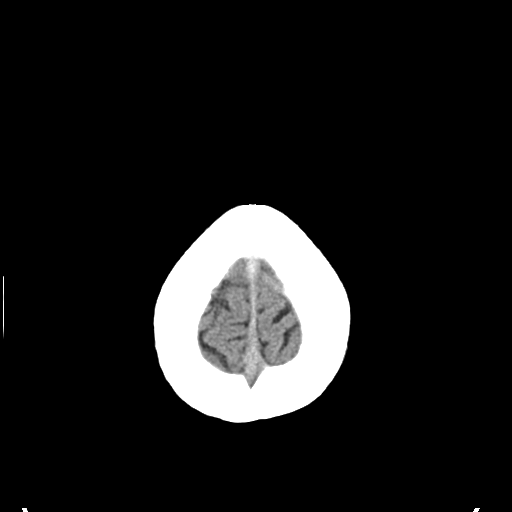

[16 of 30 positions shown; findings below may reference images not displayed]

FINDINGS: A focal area of cortical hypoattenuation is present in
the anterior right frontal lobe.  A remote lacunar infarct is noted
in the right cerebellum.  No other acute cortical infarct,
hemorrhage, or mass lesion is present.

The paranasal sinuses and mastoid air cells are clear.  The osseous
skull is intact.
IMPRESSION: 1.  Remote lacunar infarct of the right cerebellum.
2.  Age indeterminate to infarct of the anterior right frontal lobe
on image number 24.  This is likely remote as well.
3.  No definite acute intracranial abnormality to account for the
patient's symptoms.

## 2012-02-21 ENCOUNTER — Ambulatory Visit: Payer: Managed Care, Other (non HMO)

## 2012-02-21 ENCOUNTER — Other Ambulatory Visit (HOSPITAL_COMMUNITY): Payer: Managed Care, Other (non HMO)

## 2012-03-07 IMAGING — CR DG CHEST 2V
2 series · 2 of 2 positions shown · non-contrast
Comparison: None.

CLINICAL DATA: Preoperative respiratory exam; ASD closure

CHEST - 2 VIEW

[view not recorded (1 of 2)]
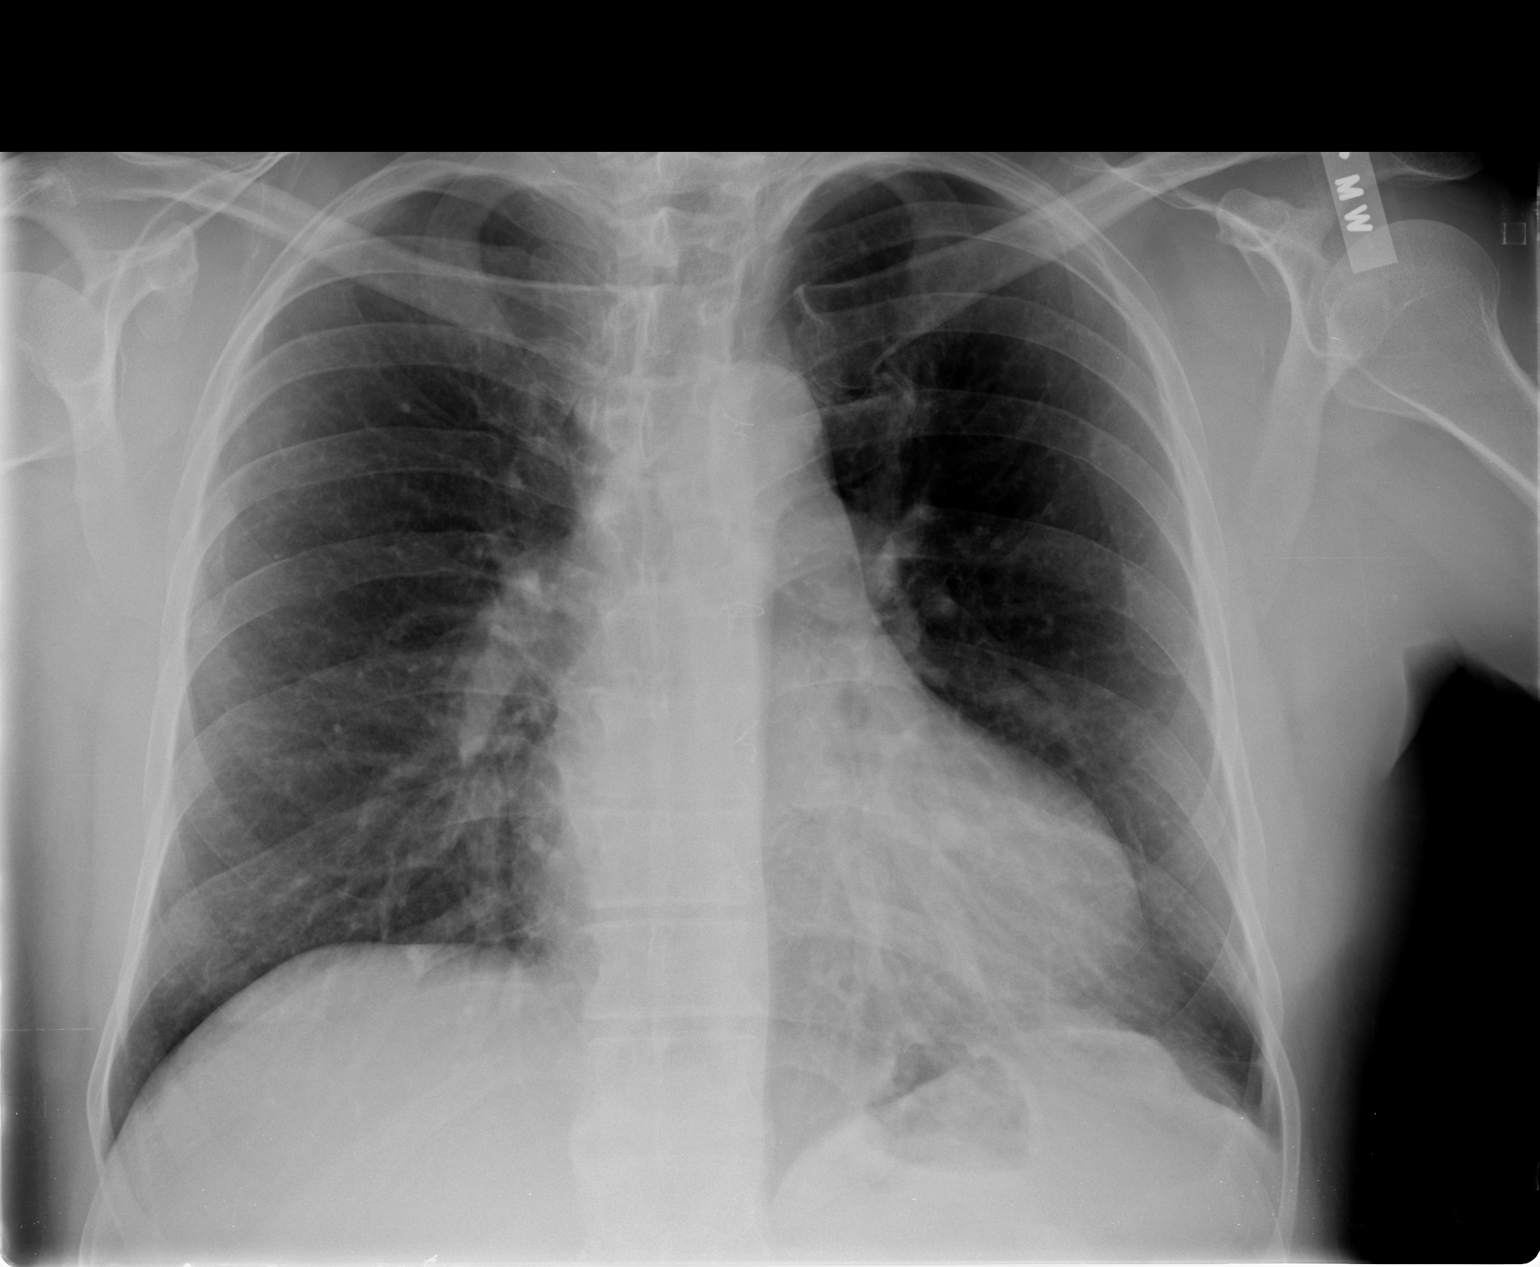

[view not recorded (2 of 2)]
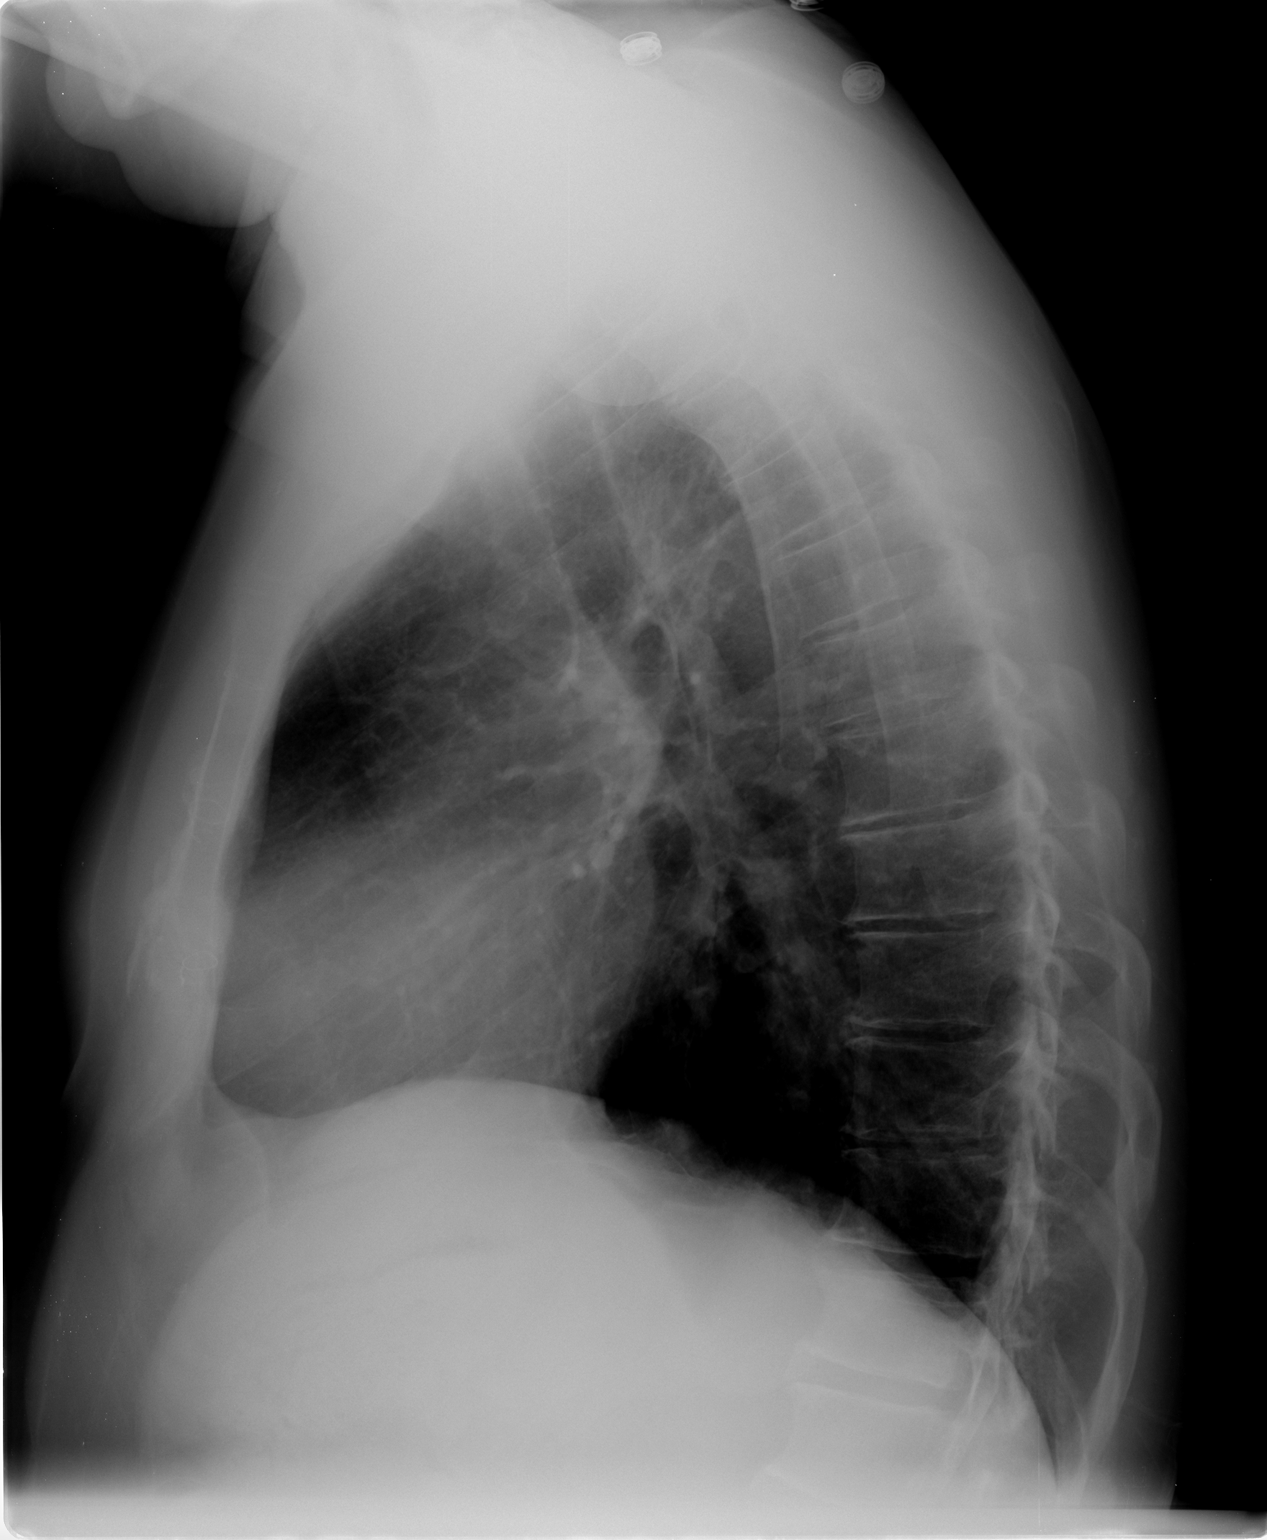

[2 of 2 positions shown; findings below may reference images not displayed]

FINDINGS: The cardiac silhouette, mediastinum, pulmonary
vasculature are within normal limits.  Both lungs are clear.
There is no acute bony abnormality.
IMPRESSION: There is no evidence of acute cardiac or pulmonary process.

## 2012-08-13 ENCOUNTER — Encounter (HOSPITAL_BASED_OUTPATIENT_CLINIC_OR_DEPARTMENT_OTHER): Payer: Self-pay | Admitting: *Deleted

## 2012-08-13 ENCOUNTER — Emergency Department (HOSPITAL_BASED_OUTPATIENT_CLINIC_OR_DEPARTMENT_OTHER)
Admission: EM | Admit: 2012-08-13 | Discharge: 2012-08-13 | Disposition: A | Discharge status: Home / Self Care | Payer: Managed Care, Other (non HMO) | Attending: Emergency Medicine | Admitting: Emergency Medicine

## 2012-08-13 DIAGNOSIS — W2209XA Striking against other stationary object, initial encounter: Secondary | ICD-10-CM | POA: Insufficient documentation

## 2012-08-13 DIAGNOSIS — IMO0002 Reserved for concepts with insufficient information to code with codable children: Secondary | ICD-10-CM | POA: Insufficient documentation

## 2012-08-13 DIAGNOSIS — E7800 Pure hypercholesterolemia, unspecified: Secondary | ICD-10-CM | POA: Insufficient documentation

## 2012-08-13 DIAGNOSIS — Z23 Encounter for immunization: Secondary | ICD-10-CM | POA: Insufficient documentation

## 2012-08-13 DIAGNOSIS — Y9289 Other specified places as the place of occurrence of the external cause: Secondary | ICD-10-CM | POA: Insufficient documentation

## 2012-08-13 DIAGNOSIS — Z8679 Personal history of other diseases of the circulatory system: Secondary | ICD-10-CM | POA: Insufficient documentation

## 2012-08-13 DIAGNOSIS — Y9389 Activity, other specified: Secondary | ICD-10-CM | POA: Insufficient documentation

## 2012-08-13 DIAGNOSIS — Z7982 Long term (current) use of aspirin: Secondary | ICD-10-CM | POA: Insufficient documentation

## 2012-08-13 DIAGNOSIS — Z87891 Personal history of nicotine dependence: Secondary | ICD-10-CM | POA: Insufficient documentation

## 2012-08-13 DIAGNOSIS — S91009A Unspecified open wound, unspecified ankle, initial encounter: Secondary | ICD-10-CM | POA: Insufficient documentation

## 2012-08-13 DIAGNOSIS — S81009A Unspecified open wound, unspecified knee, initial encounter: Secondary | ICD-10-CM | POA: Insufficient documentation

## 2012-08-13 DIAGNOSIS — Z8719 Personal history of other diseases of the digestive system: Secondary | ICD-10-CM | POA: Insufficient documentation

## 2012-08-13 MED ORDER — TETANUS-DIPHTH-ACELL PERTUSSIS 5-2.5-18.5 LF-MCG/0.5 IM SUSP
0.5000 mL | Freq: Once | INTRAMUSCULAR | Status: AC
  Administered 2012-08-13: 0.5 mL via INTRAMUSCULAR
  Filled 2012-08-13: qty 0.5

## 2012-08-13 MED ORDER — PROMETHAZINE HCL 25 MG PO TABS: 25.0000 mg | ORAL_TABLET | Freq: Four times a day (QID) | ORAL | Status: DC | PRN

## 2012-08-13 MED ORDER — HYDROCODONE-ACETAMINOPHEN 5-325 MG PO TABS: 2.0000 | ORAL_TABLET | Freq: Four times a day (QID) | ORAL | Status: DC | PRN

## 2012-08-13 NOTE — ED Provider Notes (Signed)
History    CSN: [id number] Arrival date & time 08/13/12  2026  First MD Initiated Contact with Patient 08/13/12 2136     Chief Complaint  Patient presents with  . Laceration   (Consider location/radiation/quality/duration/timing/severity/associated sxs/prior Treatment) HPI Comments: Patient is a 57 year old male who presents today with lacerations and abrasions throughout his right leg. He states that 2 hours ago he kicked a door out of anger. He states he is not in any pain, it is more of a discomfort. He has been in dilatory since the event. He has good use of his right leg and foot. He is unsure of his last tetanus shot. He is on blood thinners. No medications prior to arrival. He complains of no other injuries. No fevers, chills, numbness, tingling, paresthesias, nausea, vomiting, abdominal pain.    Patient is a 57 y.o. male presenting with skin laceration. The history is provided by the patient. No language interpreter was used.  Laceration  Past Medical History  Diagnosis Date  . MVP (mitral valve prolapse)   . Hypercholesteremia   . Internal hemorrhoids   . Migraines     Ocular  . Hiatal hernia    Past Surgical History  Procedure Laterality Date  . Cardiac surgery  1960    Congenital heart defect   Family History  Problem Relation Age of Onset  . Cancer Mother   . Cancer Father     Limphoma  . Heart disease Paternal Grandmother    History  Substance Use Topics  . Smoking status: Former Games developer  . Smokeless tobacco: Not on file  . Alcohol Use: No    Review of Systems  Constitutional: Negative for fever and chills.  Respiratory: Negative for shortness of breath.   Gastrointestinal: Negative for nausea, vomiting and abdominal pain.  Skin: Positive for wound.  All other systems reviewed and are negative.    Allergies  Review of patient's allergies indicates no known allergies.  Home Medications   Current Outpatient Rx  Name  Route  Sig  Dispense   Refill  . aspirin 81 MG tablet   Oral   Take 81 mg by mouth daily.           . fish oil-omega-3 fatty acids 1000 MG capsule   Oral   Take 1 g by mouth daily.          [name] ibuprofen (ADVIL,MOTRIN) 200 MG tablet   Oral   Take 200 mg by mouth every 6 (six) hours as needed.           . Multiple Vitamin (MULTIVITAMIN) tablet   Oral   Take 1 tablet by mouth daily.           . vitamin E 400 UNIT capsule   Oral   Take 400 Units by mouth every other day.           BP 125/96  Pulse 86  Temp(Src) 98.7 F (37.1 C) (Oral)  Resp 16  Ht 5\' 10"  (1.778 m)  Wt 195 lb (88.451 kg)  BMI 27.98 kg/m2  SpO2 100% Physical Exam  Nursing note and vitals reviewed. Constitutional: He is oriented to person, place, and time. He appears well-developed and well-nourished. No distress.  HENT:  Head: Normocephalic and atraumatic.  Right Ear: External ear normal.  Left Ear: External ear normal.  Nose: Nose normal.  Eyes: Conjunctivae are normal.  Neck: Normal range of motion. No tracheal deviation present.  Cardiovascular: Normal rate.   Pulmonary/Chest: Effort  normal. No stridor.  Abdominal: Soft. He exhibits no distension. There is no tenderness.  Musculoskeletal: Normal range of motion.       Legs: 5 cm laceration on medial side of right lower leg; 2 cm laceration on posterior side of right lower leg; abrasions and ecchymosis throughout right lower leg Neurovascularly intact. Able to dorsi and plantar flex right foot without issue.   Neurological: He is alert and oriented to person, place, and time.  Skin: Skin is warm and dry. He is not diaphoretic.  Psychiatric: He has a normal mood and affect. His behavior is normal.    ED Course  Procedures (including critical care time) Labs Reviewed - No data to display  LACERATION REPAIR Performed by: Junious Silk Authorized by: Junious Silk Consent: Verbal consent obtained. Risks and benefits: risks, benefits and alternatives were  discussed Consent given by: patient Patient identity confirmed: provided demographic data Prepped and Draped in normal sterile fashion Wound explored  Laceration Location: right lower leg - medially  Laceration Length: 5 cm  No Foreign Bodies seen or palpated  Anesthesia: local infiltration  Local anesthetic: lidocaine 2% 1% epinephrine  Anesthetic total: 4  ml  Irrigation method: syringe Amount of cleaning: standard  Skin closure: 4-0 Ethilon  Number of sutures: 7  Technique: vertical mattress  Patient tolerance: Patient tolerated the procedure well with no immediate complications.   LACERATION REPAIR Performed by: Junious Silk Authorized by: Junious Silk Consent: Verbal consent obtained. Risks and benefits: risks, benefits and alternatives were discussed Consent given by: patient Patient identity confirmed: provided demographic data Prepped and Draped in normal sterile fashion Wound explored  Laceration Location: right lower leg - posteriorly   Laceration Length: 2 cm  No Foreign Bodies seen or palpated  Anesthesia: local infiltration  Local anesthetic: lidocaine 2%   Anesthetic total: 2 ml  Irrigation method: syringe Amount of cleaning: standard  Skin closure: 4-0 Ethilon  Number of sutures: 1  Technique: simple interrupted   Patient tolerance: Patient tolerated the procedure well with no immediate complications.   No results found. 1. Laceration     MDM  Tdap booster given. Wound cleaning complete with pressure irrigation, bottom of wound visualized, no foreign bodies appreciated. Laceration occurred < 8 hours prior to repair which was well tolerated. Pt has no co morbidities to effect normal wound healing. Discussed suture home care w pt and answered questions. Pt to f-u for wound check and suture removal in 10 days. Pt is hemodynamically stable w no complaints prior to dc.     Mora Bellman, PA-C 08/13/12 2325

## 2012-08-13 NOTE — ED Notes (Signed)
EDPA at BS suturing 

## 2012-08-13 NOTE — ED Notes (Signed)
Pt c/o laceration to lower right leg by wooden door x 30 mins ago

## 2012-08-15 NOTE — ED Provider Notes (Signed)
Medical screening examination/treatment/procedure(s) were performed by non-physician practitioner and as supervising physician I was immediately available for consultation/collaboration.   Zadkiel Dragan W Baldomero Mirarchi, MD 08/15/12 1536 

## 2013-01-08 ENCOUNTER — Encounter (INDEPENDENT_AMBULATORY_CARE_PROVIDER_SITE_OTHER): Payer: Self-pay

## 2013-01-08 ENCOUNTER — Encounter: Payer: Self-pay | Admitting: Cardiovascular Disease

## 2013-01-08 ENCOUNTER — Ambulatory Visit (INDEPENDENT_AMBULATORY_CARE_PROVIDER_SITE_OTHER): Payer: Managed Care, Other (non HMO) | Admitting: Cardiovascular Disease

## 2013-01-08 VITALS — BP 125/89 | HR 76 | Ht 69.75 in | Wt 202.0 lb

## 2013-01-08 DIAGNOSIS — Q211 Atrial septal defect: Secondary | ICD-10-CM

## 2013-01-08 NOTE — Patient Instructions (Signed)
Your physician has requested that you have an echocardiogram with bubble study. Echocardiography is a painless test that uses sound waves to create images of your heart. It provides your doctor with information about the size and shape of your heart and how well your heart's chambers and valves are working. This procedure takes approximately one hour. There are no restrictions for this procedure.  Your physician recommends that you continue on your current medications as directed. Please refer to the Current Medication list given to you today.  Your physician wants you to follow-up in: 1 YEAR with Dr Cooper. You will receive a reminder letter in the mail two months in advance. If you don't receive a letter, please call our office to schedule the follow-up appointment.    

## 2013-01-08 NOTE — Progress Notes (Signed)
    HPI:  57 year old gentleman presenting for followup evaluation. The patient has a history of TIA leading to the diagnosis of multi-fenestrated ASD. He underwent transcatheter ASD closure in April 2012. He is atrial septal defects were close enough together that they were closed with a single 12 mm Amplatzer septal occluder device.  The patient is doing well. He describes no further symptoms of stroke or TIA. He has no shortness of breath, chest pain, edema, or palpitations. He walks regularly but not for exercise. He has no symptoms at his current level of physical exertion.  Outpatient Encounter Prescriptions as of 01/08/2013  Medication Sig  . aspirin 81 MG tablet Take 81 mg by mouth daily.    . fish oil-omega-3 fatty acids 1000 MG capsule Take 1 g by mouth daily.   [name] ibuprofen (ADVIL,MOTRIN) 200 MG tablet Take 200 mg by mouth every 6 (six) hours as needed.    . Multiple Vitamin (MULTIVITAMIN) tablet Take 1 tablet by mouth daily.    . ranitidine (ZANTAC) 75 MG tablet Take 75 mg by mouth as needed for heartburn.  . vitamin E 400 UNIT capsule Take 400 Units by mouth every other day.   . [DISCONTINUED] HYDROcodone-acetaminophen (NORCO/VICODIN) 5-325 MG per tablet Take 2 tablets by mouth every 6 (six) hours as needed for pain.  . [DISCONTINUED] promethazine (PHENERGAN) 25 MG tablet Take 1 tablet (25 mg total) by mouth every 6 (six) hours as needed for nausea.    No Known Allergies  Past Medical History  Diagnosis Date  . MVP (mitral valve prolapse)   . Hypercholesteremia   . Internal hemorrhoids   . Migraines     Ocular  . Hiatal hernia     ROS: Negative except as per HPI  BP 125/89  Pulse 76  Ht 5' 9.75" (1.772 m)  Wt 202 lb (91.627 kg)  BMI 29.18 kg/m2  PHYSICAL EXAM: Pt is alert and oriented, NAD HEENT: normal Neck: JVP - normal, carotids 2+= without bruits Lungs: CTA bilaterally CV: RRR with grade 2/6 early systolic murmur at the left upper sternal border Abd: soft,  NT, Positive BS, no hepatomegaly Ext: no C/C/E, distal pulses intact and equal Skin: warm/dry no rash  EKG:  Sinus rhythm with first degree AV block 80 beats per minute, incomplete right bundle branch block with right axis deviation.  ASSESSMENT AND PLAN: Ostium secundum ASD, multi-fenestrated, status post transcatheter closure. The patient will continue on his current medical program. I asked him to increase his walking so that he is getting some regular exercise. He is overdue for a followup echocardiogram. He declined this last year because of cost issues and is still remains a major concern. He understands that surveillance is important after transcatheter ASD closure and he is going to check with his insurance about out-of-pocket cost for an echocardiogram. I have recommended a 2-D echocardiogram with bubble study. He will followup in one year.  Tonny Bollman 01/08/2013 10:54 AM

## 2013-02-01 ENCOUNTER — Encounter: Payer: Self-pay | Admitting: Cardiology

## 2013-02-01 ENCOUNTER — Ambulatory Visit (HOSPITAL_COMMUNITY): Payer: Managed Care, Other (non HMO) | Attending: Cardiology | Admitting: Cardiology

## 2013-02-01 DIAGNOSIS — Q2111 Secundum atrial septal defect: Secondary | ICD-10-CM

## 2013-02-01 DIAGNOSIS — Q211 Atrial septal defect: Secondary | ICD-10-CM

## 2013-02-01 DIAGNOSIS — Z8774 Personal history of (corrected) congenital malformations of heart and circulatory system: Secondary | ICD-10-CM | POA: Insufficient documentation

## 2013-02-01 DIAGNOSIS — I079 Rheumatic tricuspid valve disease, unspecified: Secondary | ICD-10-CM | POA: Insufficient documentation

## 2013-02-01 NOTE — Progress Notes (Signed)
Echo with bubble study performed. 

## 2013-02-12 ENCOUNTER — Telehealth: Payer: Self-pay | Admitting: Cardiovascular Disease

## 2013-02-12 NOTE — Telephone Encounter (Signed)
Patient is returning call to Dr Burt Knack from yesterday, please call and advise.

## 2013-02-12 NOTE — Telephone Encounter (Signed)
Message sent to Dr. Cooper.

## 2013-02-12 NOTE — Telephone Encounter (Signed)
Spoke with patient at length. Encouraged regular exercise and avoidance of heavy isometric lifting. He will return in one year for follow-up and should have an echocardiogram with bubble study prior to that office visit.   [name] 02/12/2013 1:50 PM

## 2013-09-27 ENCOUNTER — Telehealth: Payer: Self-pay | Admitting: Cardiovascular Disease

## 2013-09-27 NOTE — Telephone Encounter (Signed)
New message          Pt would like to know the insurance code for his Echo

## 2013-09-28 NOTE — Telephone Encounter (Signed)
Spoke w/pt and advised procedure (CPT) code for his 2D echo is 93306 and it would done in office vs hospital since he is having it done in [place].

## 2013-10-08 ENCOUNTER — Other Ambulatory Visit: Payer: Managed Care, Other (non HMO)

## 2013-12-07 ENCOUNTER — Telehealth: Payer: Self-pay | Admitting: Internal Medicine

## 2013-12-14 NOTE — Telephone Encounter (Signed)
After records review Dr Carlean Purl decided his colon is not due until 06/2017.  Recall put into system.  Faxed this information to Dr Shelia Media to let them know.  Left patient detailed message with this information as well and our # to call back with any questions.

## 2014-05-18 ENCOUNTER — Ambulatory Visit (INDEPENDENT_AMBULATORY_CARE_PROVIDER_SITE_OTHER): Payer: BLUE CROSS/BLUE SHIELD | Admitting: Cardiovascular Disease

## 2014-05-18 ENCOUNTER — Encounter: Payer: Self-pay | Admitting: Cardiovascular Disease

## 2014-05-18 VITALS — BP 124/86 | HR 81 | Ht 69.75 in | Wt 194.1 lb

## 2014-05-18 DIAGNOSIS — Q211 Atrial septal defect: Secondary | ICD-10-CM | POA: Diagnosis not present

## 2014-05-18 DIAGNOSIS — Q2111 Secundum atrial septal defect: Secondary | ICD-10-CM

## 2014-05-18 NOTE — Patient Instructions (Signed)
Medication Instructions:  Your physician recommends that you continue on your current medications as directed. Please refer to the Current Medication list given to you today.  Labwork: No new orders.   Testing/Procedures: Your physician has requested that you have an echocardiogram with bubble study. Echocardiography is a painless test that uses sound waves to create images of your heart. It provides your doctor with information about the size and shape of your heart and how well your heart's chambers and valves are working. This procedure takes approximately one hour. There are no restrictions for this procedure.  Follow-Up: Your physician wants you to follow-up in: 1 YEAR with Dr Burt Knack.  You will receive a reminder letter in the mail two months in advance. If you don't receive a letter, please call our office to schedule the follow-up appointment.  Any Other Special Instructions Will Be Listed Below (If Applicable).

## 2014-05-18 NOTE — Progress Notes (Signed)
Cardiology Office Note   Date:  05/18/2014   ID:  Gavin Owens, DOB Feb 05, 1955, MRN [id number]  PCP:  Horatio Pel, MD  Cardiologist:  [name], MD    Chief Complaint  Patient presents with  . Palpitations    History of Present Illness: Gavin Owens is a 59 y.o. male who presents for follow-up of ASD. The patient was diagnosed with a multi-fenestrated ASD in 2012 after he presented with a TIA. He was treated with a transcatheter closure device. A single device was used as of the defects were close in proximity. The patient had a follow-up echocardiogram performed in 2014 and it did demonstrate residual right to left shunting around the device. After extensive discussion we elected to continue with observation. He presents today for follow-up evaluation.  The patient has occasional heart palpitations. These are long-standing and unchanged. He otherwise feels quite well. He exercises about 4 days per week. He walks 2 miles in 28 minutes. He has no exertional symptoms. He specifically denies chest pain, shortness of breath, or lightheadedness. He does admit to mild leg swelling. He said no recurrence of neurologic symptoms.  Past Medical History  Diagnosis Date  . MVP (mitral valve prolapse)   . Hypercholesteremia   . Internal hemorrhoids   . Migraines     Ocular  . Hiatal hernia     Past Surgical History  Procedure Laterality Date  . Cardiac surgery  1960    Congenital heart defect    Current Outpatient Prescriptions  Medication Sig Dispense Refill  . aspirin 81 MG tablet Take 81 mg by mouth daily.      . Multiple Vitamin (MULTIVITAMIN) tablet Take 1 tablet by mouth daily.      . ranitidine (ZANTAC) 75 MG tablet Take 75 mg by mouth as needed for heartburn.     No current facility-administered medications for this visit.    Allergies:   Review of patient's allergies indicates no known allergies.   Social History:  The patient  reports that he has quit  smoking. He does not have any smokeless tobacco history on file. He reports that he does not drink alcohol or use illicit drugs.   Family History:  The patient's  family history includes Cancer in his father and mother; Heart disease in his paternal grandmother.    ROS:  Please see the history of present illness.  Otherwise, review of systems is positive for palpitations and leg swelling.  All other systems are reviewed and negative.    PHYSICAL EXAM: VS:  BP 124/86 mmHg  Pulse 81  Ht 5' 9.75" (1.772 m)  Wt 194 lb 1.9 oz (88.052 kg)  BMI 28.04 kg/m2 , BMI Body mass index is 28.04 kg/(m^2). GEN: Well nourished, well developed, in no acute distress HEENT: normal Neck: no JVD, no masses. No carotid bruits Cardiac: RRR withgrade 2/6 early peaking ejection murmur at the left upper sternal border                Respiratory:  clear to auscultation bilaterally, normal work of breathing GI: soft, nontender, nondistended, + BS MS: no deformity or atrophy Ext: trace pretibial edema, pedal pulses 2+= bilaterally Skin: warm and dry, no rash Neuro:  Strength and sensation are intact Psych: euthymic mood, full affect  EKG:  EKG is ordered today. The ekg ordered today shows normal sinus rhythm 81 bpm, incomplete right bundle branch block, left anterior fascicular block.  Recent Labs: No results found for requested  labs within last 365 days.   Lipid Panel     Component Value Date/Time   CHOL  02/12/2010 0520    189        ATP III CLASSIFICATION:  <200     mg/dL   Desirable  200-239  mg/dL   Borderline High  >=240    mg/dL   High          TRIG 78 02/12/2010 0520   HDL 46 02/12/2010 0520   CHOLHDL 4.1 02/12/2010 0520   VLDL 16 02/12/2010 0520   LDLCALC * 02/12/2010 0520    127        Total Cholesterol/HDL:CHD Risk Coronary Heart Disease Risk Table                     Men   Women  1/2 Average Risk   3.4   3.3  Average Risk       5.0   4.4  2 X Average Risk   9.6   7.1  3 X Average  Risk  23.4   11.0        Use the calculated Patient Ratio above and the CHD Risk Table to determine the patient's CHD Risk.        ATP III CLASSIFICATION (LDL):  <100     mg/dL   Optimal  100-129  mg/dL   Near or Above                    Optimal  130-159  mg/dL   Borderline  160-189  mg/dL   High  >190     mg/dL   Very High      Wt Readings from Last 3 Encounters:  05/18/14 194 lb 1.9 oz (88.052 kg)  01/08/13 202 lb (91.627 kg)  08/13/12 195 lb (88.451 kg)     Cardiac Studies Reviewed: 2D Echo 02/01/2013: Study Conclusions  - Left ventricle: The cavity size was normal. Systolic function was normal. The estimated ejection fraction was in the range of 50% to 55%. - Right ventricle: The cavity size was moderately dilated. - Right atrium: The atrium was mildly dilated. - Atrial septum: Atrial septum not well seen but ASD repair not intact Markedly positive bubble study with right to left shunt  ASSESSMENT AND PLAN: Ostium secundum ASD, multi-fenestrated. He is status post transcatheter device closure. His last echocardiogram from 2014 was reviewed. I would like him to have a repeat 2-D echocardiogram with bubble study. Further plans regarding management pending echo findings. While ASO device is in place, there is residual right-to-left shunt around the device. This could be secondary to device shift since small second defect was present.   Current medicines are reviewed with the patient today.  The patient does not have concerns regarding medicines.  The following changes have been made:  no change  Labs/ tests ordered today include:   Orders Placed This Encounter  Procedures  . EKG 12-Lead  . 2D Echocardiogram with bubble study   Disposition:   FU one year  Signed, [name], MD  05/18/2014 5:58 PM    [address], [place], [place]  [address] Phone: [phone]; Fax: [phone]

## 2014-05-27 ENCOUNTER — Ambulatory Visit (HOSPITAL_COMMUNITY): Payer: BLUE CROSS/BLUE SHIELD

## 2014-06-22 ENCOUNTER — Other Ambulatory Visit: Payer: Self-pay | Admitting: Cardiovascular Disease

## 2014-06-22 ENCOUNTER — Other Ambulatory Visit: Payer: Self-pay

## 2014-06-22 ENCOUNTER — Ambulatory Visit (HOSPITAL_COMMUNITY): Payer: BLUE CROSS/BLUE SHIELD | Attending: Cardiovascular Disease

## 2014-06-22 DIAGNOSIS — Q211 Atrial septal defect: Secondary | ICD-10-CM

## 2014-06-22 DIAGNOSIS — Z8673 Personal history of transient ischemic attack (TIA), and cerebral infarction without residual deficits: Secondary | ICD-10-CM | POA: Insufficient documentation

## 2014-06-22 DIAGNOSIS — I341 Nonrheumatic mitral (valve) prolapse: Secondary | ICD-10-CM | POA: Insufficient documentation

## 2014-06-22 DIAGNOSIS — Q2111 Secundum atrial septal defect: Secondary | ICD-10-CM

## 2014-06-22 DIAGNOSIS — E785 Hyperlipidemia, unspecified: Secondary | ICD-10-CM | POA: Insufficient documentation

## 2014-07-25 ENCOUNTER — Ambulatory Visit (INDEPENDENT_AMBULATORY_CARE_PROVIDER_SITE_OTHER): Payer: BLUE CROSS/BLUE SHIELD | Admitting: Cardiovascular Disease

## 2014-07-25 ENCOUNTER — Encounter: Payer: Self-pay | Admitting: Cardiovascular Disease

## 2014-07-25 VITALS — BP 126/72 | HR 91 | Ht 69.75 in | Wt 195.0 lb

## 2014-07-25 DIAGNOSIS — Q211 Atrial septal defect: Secondary | ICD-10-CM | POA: Diagnosis not present

## 2014-07-25 DIAGNOSIS — Q2111 Secundum atrial septal defect: Secondary | ICD-10-CM

## 2014-07-25 NOTE — Progress Notes (Signed)
Cardiology Office Note   Date:  07/26/2014   ID:  LUCCAS TOWELL, DOB September 14, 1955, MRN [id number]  PCP:  Horatio Pel, MD  Cardiologist:  [name], MD    Chief Complaint  Patient presents with  . Palpitations    History of Present Illness: Gavin Owens is a 59 y.o. male who presents for follow-up of a secundum ASD. He had remote surgical repair, presumably of a secundum ASD, in 1960 with no records available. He then presented with a TIA in 2012 and was found to have a large right-to-left intracardiac shunt by transcranial doppler. A TEE showed a fenestrated ASD with 2 holes. A Cardiac MRI showed normal drainage of all 4 pulmonary veins and no other significant anatomic abnormalities. He underwent successful device closure with a 12 mm ASO device through the larger of the 2 defects. There was no residual shunt by bubble study at the procedure completion.   Recently the patient underwent a repeat echo with bubble study and was noted to have a large right-to-left shunt around the previously placed device. He is here today for further discussion of treatment options. He is doing ok and has occasional palpitations but no other cardiac symptoms. Specifically denies CP, dyspnea, or edema. No stroke or TIA symptoms. He remains on long term ASA.   Past Medical History  Diagnosis Date  . MVP (mitral valve prolapse)   . Hypercholesteremia   . Internal hemorrhoids   . Migraines     Ocular  . Hiatal hernia     Past Surgical History  Procedure Laterality Date  . Cardiac surgery  1960    Congenital heart defect    Current Outpatient Prescriptions  Medication Sig Dispense Refill  . aspirin 81 MG tablet Take 81 mg by mouth daily.      . Multiple Vitamin (MULTIVITAMIN) tablet Take 1 tablet by mouth daily.      . ranitidine (ZANTAC) 75 MG tablet Take 75 mg by mouth as needed for heartburn.     No current facility-administered medications for this visit.    Allergies:    Review of patient's allergies indicates no known allergies.   Social History:  The patient  reports that he has quit smoking. He does not have any smokeless tobacco history on file. He reports that he does not drink alcohol or use illicit drugs.   Family History:  The patient's  family history includes Cancer in his father and mother; Heart disease in his paternal grandmother.    ROS:  Please see the history of present illness.  All other systems are reviewed and negative.    PHYSICAL EXAM: VS:  BP 126/72 mmHg  Pulse 91  Ht 5' 9.75" (1.772 m)  Wt 195 lb (88.451 kg)  BMI 28.17 kg/m2  SpO2 95% , BMI Body mass index is 28.17 kg/(m^2). GEN: Well nourished, well developed, in no acute distress Exam not performed today as time spent with patient and wife in discussion  EKG:  EKG is not ordered today.  Recent Labs: No results found for requested labs within last 365 days.   Lipid Panel     Component Value Date/Time   CHOL  02/12/2010 0520    189        ATP III CLASSIFICATION:  <200     mg/dL   Desirable  200-239  mg/dL   Borderline High  >=240    mg/dL   High          TRIG  78 02/12/2010 0520   HDL 46 02/12/2010 0520   CHOLHDL 4.1 02/12/2010 0520   VLDL 16 02/12/2010 0520   LDLCALC * 02/12/2010 0520    127        Total Cholesterol/HDL:CHD Risk Coronary Heart Disease Risk Table                     Men   Women  1/2 Average Risk   3.4   3.3  Average Risk       5.0   4.4  2 X Average Risk   9.6   7.1  3 X Average Risk  23.4   11.0        Use the calculated Patient Ratio above and the CHD Risk Table to determine the patient's CHD Risk.        ATP III CLASSIFICATION (LDL):  <100     mg/dL   Optimal  100-129  mg/dL   Near or Above                    Optimal  130-159  mg/dL   Borderline  160-189  mg/dL   High  >190     mg/dL   Very High      Wt Readings from Last 3 Encounters:  07/25/14 195 lb (88.451 kg)  05/18/14 194 lb 1.9 oz (88.052 kg)  01/08/13 202 lb (91.627  kg)    Cardiac Studies Reviewed: Echo Bubble Study 06/22/2014: Study Conclusions  - Left ventricle: The cavity size was normal. Systolic function was normal. The estimated ejection fraction was in the range of 55% to 60%. Wall motion was normal; there were no regional wall motion abnormalities. Left ventricular diastolic function parameters were normal. - Ventricular septum: The contour showed diastolic flattening. These changes are consistent with RV volume overload. - Right ventricle: The cavity size was moderately dilated. Wall thickness was normal. - Right atrium: The atrium was mildly dilated. - Atrial septum: Agitated saline contrast study showed a moderate right-to-left atrial level shunt, in the baseline state.  Impressions:  - Consider TEE if there is clinical concern for residual right to left interatrial shunt.  Cardiac MRI 05/01/2010: Findings: There appeare to be sternal wires present. The left ventricle was normal in size and systolic function, EF 27%. No regional wall motion abnormalities and normal wall thickness. The interventricular septum was D-shaped in diastole, suggesting right ventricular volume overload. There was no evidence for existing VSD or prior patch repair. The right ventricle was moderately dilated with mild systolic dysfunction. The right atrium was mildly dilated. The left atrium was normal in size. There did appear to be a small secundum ASD in the interatrial septum. I do not see definite patch material from prior repair. The aortic valve was trileaflet without stenosis or regurgitation. There was no significant mitral regurgitation. Flow sequences to assess Qp/Qs were not done.  On MR angiography, the aorta was normal caliber. Arch vessels originated normally except for the left vertebral artery which came directly off the aortic arch. There was no evidence for patent ductus arteriosus. The main pulmonary  artery was dilated. The pulmonary veins drained normally to the left atrium.  Measurements:  LV EDV 124 mL  LV SV 68 mL  LV EF 55%  IMPRESSION: 1. Normal LV size and systolic function, EF 06%.  2. Moderately dilated RV with mild systolic dysfunction.  3. Suspected small secundum atrial septal defect.   ASSESSMENT AND PLAN: 59 yo gentleman  with history of multifenestrated secundum ASD, with evidence of atrial level communication with right-to-left shunting after remote surgical repair and more recent transcatheter closure (2012). He is here today with his wife and we have discussed potential mechanisms for recurrence of atrial level shunt. These include anatomic changes post-closure with the device pulling away from the second smaller ASD or further deficiency of previously placed surgical patch. I suspect the former is more likely as this patient had 2 fenestrations seen at the time of his transcatheter closure and a single device was used because of their close proximity to one another.   I have personally reviewed his echo and he does have a dilated RV with some evidence of RV pressure and volume overload. I suspect his RV dilatation is long-standing and it was seen on previous cardiac imaging studies several years ago. There appears to be a large intracardiac shunt based on bubbles crossing from right-to-left.   We have reviewed treatment options which include continuation of ASA daily and observation versus an eye toward transcatheter or surgical closure. After review of pros and cons of both approaches, he favors moving forward with further evaluation for potential transcatheter closure. A TEE will be scheduled to better define the anatomy of the interatrial septum and evaluate the mechanism of shunt. If favorable for device closure, will plan to proceed with this at the next available time. He will require a right heart cath at that time to evaluate for pulmonary HTN.   I  have reviewed the risks of TEE which he understands and agrees to proceed. Have also reviewed the transcatheter ASD closure procedure along with risks and potential benefits. He also agrees to this if needed. I will follow-up with him after his TEE is completed.   Current medicines are reviewed with the patient today.  The patient does not have concerns regarding medicines.  Labs/ tests ordered today include:  No orders of the defined types were placed in this encounter.    Disposition:   FU pending TEE result  Signed, [name], MD  07/26/2014 4:18 PM    [facility] [place], [place], [place]  [address] Phone: [phone]; Fax: [phone]

## 2014-07-25 NOTE — Patient Instructions (Addendum)
Medication Instructions:  Your physician recommends that you continue on your current medications as directed. Please refer to the Current Medication list given to you today.  Labwork: No new orders.   Testing/Procedures: Your physician has requested that you have a TEE. During a TEE, sound waves are used to create images of your heart. It provides your doctor with information about the size and shape of your heart and how well your heart's chambers and valves are working. In this test, a transducer is attached to the end of a flexible tube that's guided down your throat and into your esophagus (the tube leading from you mouth to your stomach) to get a more detailed image of your heart. You are not awake for the procedure. Please see the instruction sheet given to you today. For further information please visit [email or web address]. (08/01/14 or 08/03/14)  Follow-Up: We will arrange further follow-up after TEE is performed.   Any Other Special Instructions Will Be Listed Below (If Applicable).

## 2014-08-01 ENCOUNTER — Ambulatory Visit (HOSPITAL_COMMUNITY)
Admission: RE | Admit: 2014-08-01 | Discharge: 2014-08-01 | Disposition: A | Discharge status: Home / Self Care | Payer: BLUE CROSS/BLUE SHIELD | Source: Ambulatory Visit | Admitting: Cardiovascular Disease

## 2014-08-01 ENCOUNTER — Encounter (HOSPITAL_COMMUNITY): Payer: Self-pay | Admitting: *Deleted

## 2014-08-01 ENCOUNTER — Encounter (HOSPITAL_COMMUNITY)
Admission: RE | Disposition: A | Discharge status: Home / Self Care | Payer: Self-pay | Source: Ambulatory Visit | Attending: Cardiovascular Disease

## 2014-08-01 ENCOUNTER — Ambulatory Visit (HOSPITAL_COMMUNITY)
Admission: RE | Admit: 2014-08-01 | Discharge: 2014-08-01 | Disposition: A | Discharge status: Home / Self Care | Payer: BLUE CROSS/BLUE SHIELD | Source: Ambulatory Visit | Attending: Cardiovascular Disease | Admitting: Cardiovascular Disease

## 2014-08-01 DIAGNOSIS — Q211 Atrial septal defect: Secondary | ICD-10-CM

## 2014-08-01 DIAGNOSIS — R002 Palpitations: Secondary | ICD-10-CM | POA: Diagnosis present

## 2014-08-01 DIAGNOSIS — Z7982 Long term (current) use of aspirin: Secondary | ICD-10-CM | POA: Insufficient documentation

## 2014-08-01 DIAGNOSIS — E780 Pure hypercholesterolemia: Secondary | ICD-10-CM | POA: Insufficient documentation

## 2014-08-01 DIAGNOSIS — Z8673 Personal history of transient ischemic attack (TIA), and cerebral infarction without residual deficits: Secondary | ICD-10-CM | POA: Diagnosis not present

## 2014-08-01 DIAGNOSIS — Q2111 Secundum atrial septal defect: Secondary | ICD-10-CM | POA: Insufficient documentation

## 2014-08-01 DIAGNOSIS — Z87891 Personal history of nicotine dependence: Secondary | ICD-10-CM | POA: Diagnosis not present

## 2014-08-01 HISTORY — PX: TEE WITHOUT CARDIOVERSION: SHX5443

## 2014-08-01 SURGERY — ECHOCARDIOGRAM, TRANSESOPHAGEAL
Anesthesia: Moderate Sedation

## 2014-08-01 MED ORDER — FENTANYL CITRATE (PF) 100 MCG/2ML IJ SOLN
INTRAMUSCULAR | Status: DC | PRN
  Administered 2014-08-01 (×2): 25 ug via INTRAVENOUS

## 2014-08-01 MED ORDER — DIPHENHYDRAMINE HCL 50 MG/ML IJ SOLN
INTRAMUSCULAR | Status: AC
  Filled 2014-08-01: qty 1

## 2014-08-01 MED ORDER — MIDAZOLAM HCL 10 MG/2ML IJ SOLN
INTRAMUSCULAR | Status: DC | PRN
  Administered 2014-08-01 (×2): 2 mg via INTRAVENOUS

## 2014-08-01 MED ORDER — SODIUM CHLORIDE 0.9 % IV SOLN
INTRAVENOUS | Status: DC
  Administered 2014-08-01: 500 mL via INTRAVENOUS

## 2014-08-01 MED ORDER — MIDAZOLAM HCL 5 MG/ML IJ SOLN
INTRAMUSCULAR | Status: AC
  Filled 2014-08-01: qty 2

## 2014-08-01 MED ORDER — FENTANYL CITRATE (PF) 100 MCG/2ML IJ SOLN
INTRAMUSCULAR | Status: AC
  Filled 2014-08-01: qty 2

## 2014-08-01 NOTE — Interval H&P Note (Signed)
History and Physical Interval Note:  08/01/2014 1:41 PM  Chase Caller  has presented today for surgery, with the diagnosis of ASD  The various methods of treatment have been discussed with the patient and family. After consideration of risks, benefits and other options for treatment, the patient has consented to  Procedure(s): TRANSESOPHAGEAL ECHOCARDIOGRAM (TEE) (N/A) as a surgical intervention .  The patient's history has been reviewed, patient examined, no change in status, stable for surgery.  I have reviewed the patient's chart and labs.  Questions were answered to the patient's satisfaction.     Truxton Stupka

## 2014-08-01 NOTE — Discharge Instructions (Signed)
Transesophageal Echocardiogram °Transesophageal echocardiography (TEE) is a picture test of your heart using sound waves. The pictures taken can give very detailed pictures of your heart. This can help your doctor see if there are problems with your heart. TEE can check: °· If your heart has blood clots in it. °· How well your heart valves are working. °· If you have an infection on the inside of your heart. °· Some of the major arteries of your heart. °· If your heart valve is working after a repair. °· Your heart before a procedure that uses a shock to your heart to get the rhythm back to normal. °BEFORE THE PROCEDURE °· Do not eat or drink for 6 hours before the procedure or as told by your doctor. °· Make plans to have someone drive you home after the procedure. Do not drive yourself home. °· An IV tube will be put in your arm. °PROCEDURE °· You will be given a medicine to help you relax (sedative). It will be given through the IV tube. °· A numbing medicine will be sprayed or gargled in the back of your throat to help numb it. °· The tip of the probe is placed into the back of your mouth. You will be asked to swallow. This helps to pass the probe into your esophagus. °· Once the tip of the probe is in the right place, your doctor can take pictures of your heart. °· You may feel pressure at the back of your throat. °AFTER THE PROCEDURE °· You will be taken to a recovery area so the sedative can wear off. °· Your throat may be sore and scratchy. This will go away slowly over time. °· You will go home when you are fully awake and able to swallow liquids. °· You should have someone stay with you for the next 24 hours. °· Do not drive or operate machinery for the next 24 hours. °Document Released: 11/18/2008 Document Revised: 01/26/2013 Document Reviewed: 07/23/2012 °[employer]® Patient Information ©2015 [employer]. This information is not intended to replace advice given to you by your health care provider. Make  sure you discuss any questions you have with your health care provider. ° °

## 2014-08-01 NOTE — Op Note (Signed)
INDICATIONS: atrial septal defect  PROCEDURE:   Informed consent was obtained prior to the procedure. The risks, benefits and alternatives for the procedure were discussed and the patient comprehended these risks.  Risks include, but are not limited to, cough, sore throat, vomiting, nausea, somnolence, esophageal and stomach trauma or perforation, bleeding, low blood pressure, aspiration, pneumonia, infection, trauma to the teeth and death.    After a procedural time-out, the oropharynx was anesthetized with 20% benzocaine spray. The patient was given 4 mg versed and 50 mcg fentanyl for moderate sedation.   The transesophageal probe was inserted in the esophagus and stomach without difficulty and multiple views were obtained.  The patient was kept under observation until the patient left the procedure room.  The patient left the procedure room in stable condition.   Agitated microbubble saline contrast was administered.  COMPLICATIONS:    There were no immediate complications.  FINDINGS:  There is a well seated Amplatzer occluder device on the atrial septum. There appears to be a tiny residual defect with small bidirectional shunt seen by both color Doppler and saline contrast injection. It appears to be at the anterosuperior aspect of the occluder, towards the aortic root. Also noted is severe pulmonary valve insufficiency and moderate enlargement of the right heart chambers, with normal RV systolic function and normal PA pressure.  RECOMMENDATIONS:    Consider repeat attempt at closure with an additional device.   Time Spent Directly with the Patient:  30 minutes   Gavin Owens 08/01/2014, 2:49 PM

## 2014-08-01 NOTE — H&P (View-Only) (Signed)
Cardiology Office Note   Date:  07/26/2014   ID:  Gavin Owens, DOB 1955-04-11, MRN [id number]  PCP:  Gavin Pel, MD  Cardiologist:  Gavin Mocha, MD    Chief Complaint  Patient presents with  . Palpitations    History of Present Illness: Gavin Owens is a 59 y.o. male who presents for follow-up of a secundum ASD. He had remote surgical repair, presumably of a secundum ASD, in 1960 with no records available. He then presented with a TIA in 2012 and was found to have a large right-to-left intracardiac shunt by transcranial doppler. A TEE showed a fenestrated ASD with 2 holes. A Cardiac MRI showed normal drainage of all 4 pulmonary veins and no other significant anatomic abnormalities. He underwent successful device closure with a 12 mm ASO device through the larger of the 2 defects. There was no residual shunt by bubble study at the procedure completion.   Recently the patient underwent a repeat echo with bubble study and was noted to have a large right-to-left shunt around the previously placed device. He is here today for further discussion of treatment options. He is doing ok and has occasional palpitations but no other cardiac symptoms. Specifically denies CP, dyspnea, or edema. No stroke or TIA symptoms. He remains on long term ASA.   Past Medical History  Diagnosis Date  . MVP (mitral valve prolapse)   . Hypercholesteremia   . Internal hemorrhoids   . Migraines     Ocular  . Hiatal hernia     Past Surgical History  Procedure Laterality Date  . Cardiac surgery  1960    Congenital heart defect    Current Outpatient Prescriptions  Medication Sig Dispense Refill  . aspirin 81 MG tablet Take 81 mg by mouth daily.      . Multiple Vitamin (MULTIVITAMIN) tablet Take 1 tablet by mouth daily.      . ranitidine (ZANTAC) 75 MG tablet Take 75 mg by mouth as needed for heartburn.     No current facility-administered medications for this visit.    Allergies:    Review of patient's allergies indicates no known allergies.   Social History:  The patient  reports that he has quit smoking. He does not have any smokeless tobacco history on file. He reports that he does not drink alcohol or use illicit drugs.   Family History:  The patient's  family history includes Cancer in his father and mother; Heart disease in his paternal grandmother.    ROS:  Please see the history of present illness.  All other systems are reviewed and negative.    PHYSICAL EXAM: VS:  BP 126/72 mmHg  Pulse 91  Ht 5' 9.75" (1.772 m)  Wt 195 lb (88.451 kg)  BMI 28.17 kg/m2  SpO2 95% , BMI Body mass index is 28.17 kg/(m^2). GEN: Well nourished, well developed, in no acute distress Exam not performed today as time spent with patient and wife in discussion  EKG:  EKG is not ordered today.  Recent Labs: No results found for requested labs within last 365 days.   Lipid Panel     Component Value Date/Time   CHOL  02/12/2010 0520    189        ATP III CLASSIFICATION:  <200     mg/dL   Desirable  200-239  mg/dL   Borderline High  >=240    mg/dL   High          TRIG  78 02/12/2010 0520   HDL 46 02/12/2010 0520   CHOLHDL 4.1 02/12/2010 0520   VLDL 16 02/12/2010 0520   LDLCALC * 02/12/2010 0520    127        Total Cholesterol/HDL:CHD Risk Coronary Heart Disease Risk Table                     Men   Women  1/2 Average Risk   3.4   3.3  Average Risk       5.0   4.4  2 X Average Risk   9.6   7.1  3 X Average Risk  23.4   11.0        Use the calculated Patient Ratio above and the CHD Risk Table to determine the patient's CHD Risk.        ATP III CLASSIFICATION (LDL):  <100     mg/dL   Optimal  100-129  mg/dL   Near or Above                    Optimal  130-159  mg/dL   Borderline  160-189  mg/dL   High  >190     mg/dL   Very High      Wt Readings from Last 3 Encounters:  07/25/14 195 lb (88.451 kg)  05/18/14 194 lb 1.9 oz (88.052 kg)  01/08/13 202 lb (91.627  kg)    Cardiac Studies Reviewed: Echo Bubble Study 06/22/2014: Study Conclusions  - Left ventricle: The cavity size was normal. Systolic function was normal. The estimated ejection fraction was in the range of 55% to 60%. Wall motion was normal; there were no regional wall motion abnormalities. Left ventricular diastolic function parameters were normal. - Ventricular septum: The contour showed diastolic flattening. These changes are consistent with RV volume overload. - Right ventricle: The cavity size was moderately dilated. Wall thickness was normal. - Right atrium: The atrium was mildly dilated. - Atrial septum: Agitated saline contrast study showed a moderate right-to-left atrial level shunt, in the baseline state.  Impressions:  - Consider TEE if there is clinical concern for residual right to left interatrial shunt.  Cardiac MRI 05/01/2010: Findings: There appeare to be sternal wires present. The left ventricle was normal in size and systolic function, EF 97%. No regional wall motion abnormalities and normal wall thickness. The interventricular septum was D-shaped in diastole, suggesting right ventricular volume overload. There was no evidence for existing VSD or prior patch repair. The right ventricle was moderately dilated with mild systolic dysfunction. The right atrium was mildly dilated. The left atrium was normal in size. There did appear to be a small secundum ASD in the interatrial septum. I do not see definite patch material from prior repair. The aortic valve was trileaflet without stenosis or regurgitation. There was no significant mitral regurgitation. Flow sequences to assess Qp/Qs were not done.  On MR angiography, the aorta was normal caliber. Arch vessels originated normally except for the left vertebral artery which came directly off the aortic arch. There was no evidence for patent ductus arteriosus. The main pulmonary  artery was dilated. The pulmonary veins drained normally to the left atrium.  Measurements:  LV EDV 124 mL  LV SV 68 mL  LV EF 55%  IMPRESSION: 1. Normal LV size and systolic function, EF 35%.  2. Moderately dilated RV with mild systolic dysfunction.  3. Suspected small secundum atrial septal defect.   ASSESSMENT AND PLAN: 59 yo gentleman  with history of multifenestrated secundum ASD, with evidence of atrial level communication with right-to-left shunting after remote surgical repair and more recent transcatheter closure (2012). He is here today with his wife and we have discussed potential mechanisms for recurrence of atrial level shunt. These include anatomic changes post-closure with the device pulling away from the second smaller ASD or further deficiency of previously placed surgical patch. I suspect the former is more likely as this patient had 2 fenestrations seen at the time of his transcatheter closure and a single device was used because of their close proximity to one another.   I have personally reviewed his echo and he does have a dilated RV with some evidence of RV pressure and volume overload. I suspect his RV dilatation is long-standing and it was seen on previous cardiac imaging studies several years ago. There appears to be a large intracardiac shunt based on bubbles crossing from right-to-left.   We have reviewed treatment options which include continuation of ASA daily and observation versus an eye toward transcatheter or surgical closure. After review of pros and cons of both approaches, he favors moving forward with further evaluation for potential transcatheter closure. A TEE will be scheduled to better define the anatomy of the interatrial septum and evaluate the mechanism of shunt. If favorable for device closure, will plan to proceed with this at the next available time. He will require a right heart cath at that time to evaluate for pulmonary HTN.   I  have reviewed the risks of TEE which he understands and agrees to proceed. Have also reviewed the transcatheter ASD closure procedure along with risks and potential benefits. He also agrees to this if needed. I will follow-up with him after his TEE is completed.   Current medicines are reviewed with the patient today.  The patient does not have concerns regarding medicines.  Labs/ tests ordered today include:  No orders of the defined types were placed in this encounter.    Disposition:   FU pending TEE result  Signed, Gavin Mocha, MD  07/26/2014 4:18 PM    [facility] [address], [place], [place]  [address] Phone: [phone]; Fax: [phone]

## 2014-08-01 NOTE — Progress Notes (Signed)
  Echocardiogram Echocardiogram Transesophageal has been performed.  Gavin Owens 08/01/2014, 2:47 PM

## 2014-08-02 ENCOUNTER — Encounter (HOSPITAL_COMMUNITY): Payer: Self-pay | Admitting: Cardiovascular Disease

## 2014-08-11 ENCOUNTER — Telehealth: Payer: Self-pay | Admitting: Cardiovascular Disease

## 2014-08-11 DIAGNOSIS — I371 Nonrheumatic pulmonary valve insufficiency: Secondary | ICD-10-CM

## 2014-08-11 NOTE — Telephone Encounter (Signed)
New message   Pt called states that he had an ECHO completed and he is req a call back to discuss the results

## 2014-08-11 NOTE — Telephone Encounter (Signed)
TEE performed 08/01/14.  The pt is calling to discuss these results.

## 2014-08-14 NOTE — Telephone Encounter (Signed)
Discussed TEE results with patient. 2 notable findings - 1 is wide-open Pulmonic insufficiency, other is bidirectional shunt around ASO device. Surface echo shows RV dilatation and dysfunction. Plan:  Cardiac MRI to assess Pulmonic insufficiency, RV volume and function  I will review TEE with colleagues and I am considering further device closure of residual inter-atrial septal communication unless some intervention on pulmonic valve is required - may need to review with [facility] - Dr Raul Del  Please arrange Cardiac MRI with Dr Johnsie Cancel - Indication: Pulmonic Insufficiency  There is an MR study from 2012 for comparison  thx

## 2014-08-15 NOTE — Telephone Encounter (Signed)
LM TO CALL BACK ./CY 

## 2014-08-15 NOTE — Telephone Encounter (Signed)
Schedule cardiac MRI this week.  For PR

## 2014-08-17 NOTE — Telephone Encounter (Signed)
Pt scheduled for Cardiac MRI 7/14 at 11 AM.  Pt aware and knows to show up 15-30 minutes early for lab work. Pt has no additional questions at this time.

## 2014-08-18 ENCOUNTER — Ambulatory Visit (HOSPITAL_COMMUNITY)
Admission: RE | Admit: 2014-08-18 | Discharge: 2014-08-18 | Disposition: A | Discharge status: Home / Self Care | Payer: BLUE CROSS/BLUE SHIELD | Source: Ambulatory Visit | Attending: Cardiovascular Disease | Admitting: Cardiovascular Disease

## 2014-08-18 DIAGNOSIS — I371 Nonrheumatic pulmonary valve insufficiency: Secondary | ICD-10-CM | POA: Insufficient documentation

## 2014-08-18 DIAGNOSIS — Z9889 Other specified postprocedural states: Secondary | ICD-10-CM | POA: Diagnosis not present

## 2014-08-18 LAB — POCT I-STAT CREATININE: Creatinine, Ser: 0.9 mg/dL (ref 0.61–1.24)

## 2014-08-18 MED ORDER — GADOBENATE DIMEGLUMINE 529 MG/ML IV SOLN
27.0000 mL | Freq: Once | INTRAVENOUS | Status: AC | PRN
  Administered 2014-08-18: 27 mL via INTRAVENOUS

## 2014-10-13 DIAGNOSIS — Z8673 Personal history of transient ischemic attack (TIA), and cerebral infarction without residual deficits: Secondary | ICD-10-CM | POA: Insufficient documentation

## 2014-10-13 DIAGNOSIS — I371 Nonrheumatic pulmonary valve insufficiency: Secondary | ICD-10-CM | POA: Insufficient documentation

## 2014-10-13 DIAGNOSIS — R002 Palpitations: Secondary | ICD-10-CM | POA: Insufficient documentation

## 2014-10-13 DIAGNOSIS — Q211 Atrial septal defect, unspecified: Secondary | ICD-10-CM | POA: Insufficient documentation

## 2014-10-13 DIAGNOSIS — I517 Cardiomegaly: Secondary | ICD-10-CM | POA: Insufficient documentation

## 2014-12-08 DIAGNOSIS — Z8774 Personal history of (corrected) congenital malformations of heart and circulatory system: Secondary | ICD-10-CM | POA: Insufficient documentation

## 2014-12-09 DIAGNOSIS — Z8719 Personal history of other diseases of the digestive system: Secondary | ICD-10-CM | POA: Insufficient documentation

## 2014-12-09 DIAGNOSIS — G8918 Other acute postprocedural pain: Secondary | ICD-10-CM | POA: Insufficient documentation

## 2015-01-13 ENCOUNTER — Encounter: Payer: BLUE CROSS/BLUE SHIELD | Admitting: Cardiovascular Disease

## 2015-09-07 ENCOUNTER — Encounter: Payer: Self-pay | Admitting: Cardiovascular Disease

## 2015-09-07 ENCOUNTER — Ambulatory Visit (INDEPENDENT_AMBULATORY_CARE_PROVIDER_SITE_OTHER): Payer: BLUE CROSS/BLUE SHIELD | Admitting: Cardiovascular Disease

## 2015-09-07 VITALS — BP 124/82 | HR 89 | Ht 69.0 in | Wt 203.8 lb

## 2015-09-07 DIAGNOSIS — Q2111 Secundum atrial septal defect: Secondary | ICD-10-CM

## 2015-09-07 DIAGNOSIS — Q211 Atrial septal defect: Secondary | ICD-10-CM

## 2015-09-07 DIAGNOSIS — I371 Nonrheumatic pulmonary valve insufficiency: Secondary | ICD-10-CM | POA: Diagnosis not present

## 2015-09-07 NOTE — Patient Instructions (Addendum)
Medication Instructions:  Your physician recommends that you continue on your current medications as directed. Please refer to the Current Medication list given to you today.  Labwork: No new orders.   Testing/Procedures: Your physician has requested that you have an echocardiogram in January 2018. Echocardiography is a painless test that uses sound waves to create images of your heart. It provides your doctor with information about the size and shape of your heart and how well your heart's chambers and valves are working. This procedure takes approximately one hour. There are no restrictions for this procedure.  Follow-Up: Your physician wants you to follow-up in: 1 YEAR with Dr Burt Knack.  You will receive a reminder letter in the mail two months in advance. If you don't receive a letter, please call our office to schedule the follow-up appointment.   Any Other Special Instructions Will Be Listed Below (If Applicable).  Your physician discussed the importance of taking an antibiotic prior to any dental, gastrointestinal, genitourinary procedures to prevent damage to the heart valves from infection.      If you need a refill on your cardiac medications before your next appointment, please call your pharmacy.

## 2015-09-07 NOTE — Progress Notes (Signed)
Cardiology Office Note Date:  09/07/2015   ID:  Gavin Owens, DOB 13-Jun-1955, MRN [id number]  PCP:  Horatio Pel, MD  Cardiologist:  [name], MD    Chief Complaint  Patient presents with  . Follow-up   History of Present Illness: Gavin Owens is a 60 y.o. male who presents for follow-up evaluation. The patient has a history of atrial septal defect status post remote operative repair in 1960. He likely had pulmonic stenosis with valvotomy at that time. He presented with a TIA in 2012 and was found to have a large right to left intracardiac shunt. He was found to have a fenestrated ASD and underwent device closure with a 12 mm Amplatzer septal occluder device at that time. He had recurrent shunting across the interatrial septum seen and ultimately was found to have RV dilatation and severe pulmonic insufficiency. He was referred to Dr Corine Shelter at [facility] and underwent median sternotomy with removal of his Amplatzer septal occluder, repair of the atrial septal defect, and pulmonary valve replacement with a 27 mm [employer] trifecta bioprosthetic valve with patch augmentation of the main pulmonary artery. He had an uncomplicated postoperative course and was only in the hospital for 3 days.  The patient is doing remarkably well. He has no cardiopulmonary symptoms. Today, he denies symptoms of palpitations, chest pain, shortness of breath, orthopnea, PND, lower extremity edema, dizziness, or syncope.   Past Medical History:  Diagnosis Date  . Hiatal hernia   . Hypercholesteremia   . Internal hemorrhoids   . Migraines    Ocular  . MVP (mitral valve prolapse)     Past Surgical History:  Procedure Laterality Date  . CARDIAC SURGERY  1960   Congenital heart defect  . TEE WITHOUT CARDIOVERSION N/A 08/01/2014   Procedure: TRANSESOPHAGEAL ECHOCARDIOGRAM (TEE);  Surgeon: Sanda Klein, MD;  Location: [facility] ENDOSCOPY;  Service: Cardiovascular;  Laterality: N/A;    Current  Outpatient Prescriptions  Medication Sig Dispense Refill  . aspirin 81 MG tablet Take 81 mg by mouth daily.      . Multiple Vitamin (MULTIVITAMIN) tablet Take 1 tablet by mouth daily.       No current facility-administered medications for this visit.     Allergies:   Review of patient's allergies indicates no known allergies.   Social History:  The patient  reports that he has quit smoking. He does not have any smokeless tobacco history on file. He reports that he does not drink alcohol or use drugs.   Family History:  The patient's  family history includes Cancer in his father and mother; Heart disease in his paternal grandmother.    ROS:  Please see the history of present illness.  All other systems are reviewed and negative.    PHYSICAL EXAM: VS:  BP 124/82 (BP Location: Left Arm, Patient Position: Sitting, Cuff Size: Normal)   Pulse 89   Ht 5\' 9"  (1.753 m)   Wt 203 lb 12.8 oz (92.4 kg)   BMI 30.10 kg/m  , BMI Body mass index is 30.1 kg/m. GEN: Well nourished, well developed, in no acute distress  HEENT: normal  Neck: no JVD, no masses. No carotid bruits Cardiac: RRR without murmur or gallop                Respiratory:  clear to auscultation bilaterally, normal work of breathing GI: soft, nontender, nondistended, + BS MS: no deformity or atrophy  Ext: trace pretibial edema, venous telangiectasias noted Skin: warm  and dry, no rash Neuro:  Strength and sensation are intact Psych: euthymic mood, full affect  EKG:  EKG is ordered today. The ekg ordered today shows normal sinus rhythm 89 bpm, right superior axis deviation.  Recent Labs: No results found for requested labs within last 8760 hours.   Lipid Panel     Component Value Date/Time   CHOL  02/12/2010 0520    189        ATP III CLASSIFICATION:  <200     mg/dL   Desirable  200-239  mg/dL   Borderline High  >=240    mg/dL   High          TRIG 78 02/12/2010 0520   HDL 46 02/12/2010 0520   CHOLHDL 4.1  02/12/2010 0520   VLDL 16 02/12/2010 0520   LDLCALC (H) 02/12/2010 0520    127        Total Cholesterol/HDL:CHD Risk Coronary Heart Disease Risk Table                     Men   Women  1/2 Average Risk   3.4   3.3  Average Risk       5.0   4.4  2 X Average Risk   9.6   7.1  3 X Average Risk  23.4   11.0        Use the calculated Patient Ratio above and the CHD Risk Table to determine the patient's CHD Risk.        ATP III CLASSIFICATION (LDL):  <100     mg/dL   Optimal  100-129  mg/dL   Near or Above                    Optimal  130-159  mg/dL   Borderline  160-189  mg/dL   High  >190     mg/dL   Very High      Wt Readings from Last 3 Encounters:  09/07/15 203 lb 12.8 oz (92.4 kg)  07/25/14 195 lb (88.5 kg)  05/18/14 194 lb 1.9 oz (88.1 kg)     Cardiac Studies Reviewed: 2D Echo 01-12-2015: INTERPRETATION SUMMARY  Status post operative closure of atrial septal defect with subsequest device closure of  residual shunting.  Now status post re-repair of the atrial septum with device removal and patch closure.  Status post pulmonary valve replacement.    Normal left ventricular size and systolic function.  Upper limits of normal to mildly dilated right ventricle with normal systolic function.  Very well seated pulmonic bioprosthesis. Peak gradient 8 mmHg and trivial regurgitation.  No residual atrial shunting seen by color Doppler.   ASSESSMENT AND PLAN: 60 year old gentleman with ostium secundum ASD and pulmonic valve disease now status post surgical patch repair of his atrial septal defect and bioprosthetic pulmonic valve replacement. The patient is doing very well. I have reviewed all of his notes from [facility] as well as his follow-up evaluation by Dr. Corine Shelter. Will repeat an echocardiogram for 1 year follow-up to assess for RV remodeling and integrity of his surgical repair. I will plan on seeing him back in one year. SBE  prophylaxis is reviewed.  Current medicines are reviewed with the patient today.  The patient does not have concerns regarding medicines.  Labs/ tests ordered today include:   Orders Placed This Encounter  Procedures  . ECHOCARDIOGRAM COMPLETE    Disposition:   FU echo in January and  office visit in one year  Signed, [name], MD  09/07/2015 1:59 PM    [facility] [address], [place], [place]  [address] Phone: [phone]; Fax: [phone]

## 2015-09-11 ENCOUNTER — Encounter: Payer: Self-pay | Admitting: Cardiovascular Disease

## 2015-09-15 NOTE — Addendum Note (Signed)
Addended by: Freada Bergeron on: 09/15/2015 04:40 PM   Modules accepted: Orders

## 2016-02-09 ENCOUNTER — Other Ambulatory Visit: Payer: Self-pay

## 2016-02-09 ENCOUNTER — Ambulatory Visit (HOSPITAL_COMMUNITY): Payer: BLUE CROSS/BLUE SHIELD | Attending: Cardiovascular Disease

## 2016-02-09 DIAGNOSIS — Q211 Atrial septal defect: Secondary | ICD-10-CM | POA: Diagnosis not present

## 2016-02-09 DIAGNOSIS — Q2111 Secundum atrial septal defect: Secondary | ICD-10-CM

## 2016-02-09 DIAGNOSIS — I351 Nonrheumatic aortic (valve) insufficiency: Secondary | ICD-10-CM | POA: Insufficient documentation

## 2016-02-09 DIAGNOSIS — I371 Nonrheumatic pulmonary valve insufficiency: Secondary | ICD-10-CM | POA: Diagnosis present

## 2016-02-09 DIAGNOSIS — I5189 Other ill-defined heart diseases: Secondary | ICD-10-CM | POA: Insufficient documentation

## 2017-02-20 ENCOUNTER — Ambulatory Visit: Payer: BLUE CROSS/BLUE SHIELD | Admitting: Cardiovascular Disease

## 2017-04-04 ENCOUNTER — Ambulatory Visit (INDEPENDENT_AMBULATORY_CARE_PROVIDER_SITE_OTHER): Payer: BLUE CROSS/BLUE SHIELD | Admitting: Cardiovascular Disease

## 2017-04-04 ENCOUNTER — Encounter: Payer: Self-pay | Admitting: Cardiovascular Disease

## 2017-04-04 VITALS — BP 142/90 | HR 82 | Ht 70.0 in | Wt 209.8 lb

## 2017-04-04 DIAGNOSIS — I379 Nonrheumatic pulmonary valve disorder, unspecified: Secondary | ICD-10-CM | POA: Diagnosis not present

## 2017-04-04 DIAGNOSIS — Q211 Atrial septal defect: Secondary | ICD-10-CM

## 2017-04-04 DIAGNOSIS — Q2111 Secundum atrial septal defect: Secondary | ICD-10-CM

## 2017-04-04 NOTE — Patient Instructions (Signed)

## 2017-04-04 NOTE — Progress Notes (Signed)
Cardiology Office Note Date:  04/04/2017   ID:  STEFANOS HAYNESWORTH, DOB [birth year], MRN [id number]  PCP:  [name], MD  Cardiologist:  [name], MD    Chief Complaint  Patient presents with  . Follow-up    congenital heart disease     History of Present Illness: Gavin Owens is a 62 y.o. male who presents for follow-up of atrial septal defect and pulmonic valve disease.  The patient had remote heart surgery as a young child.  He later underwent Amplatzer septal occluder placement to treat a residual ASD.  He developed signs of progressive RV enlargement and severe pulmonic insufficiency as well as residual atrial level shunting.  He is here with his wife today.  He feels fine.  He is physically active and has no exertional symptoms.  In 2016 he underwent redo median sternotomy, removal of his Amplatzer atrial septal occluder, repair of ASD with a Cardiocel patch, replacement of his pulmonic valve with a 27 mm Saint Jude medical trifecta bioprosthetic valve, and patch augmentation of the main pulmonary artery.  His surgery was uncomplicated.  He specifically denies chest pain, shortness of breath, heart palpitations, lightheadedness, or syncope.  Past Medical History:  Diagnosis Date  . Hiatal hernia   . Hypercholesteremia   . Internal hemorrhoids   . Migraines    Ocular  . MVP (mitral valve prolapse)     Past Surgical History:  Procedure Laterality Date  . CARDIAC SURGERY  1960   Congenital heart defect  . TEE WITHOUT CARDIOVERSION N/A 08/01/2014   Procedure: TRANSESOPHAGEAL ECHOCARDIOGRAM (TEE);  Surgeon: Sanda Klein, MD;  Location: [facility] ENDOSCOPY;  Service: Cardiovascular;  Laterality: N/A;    Current Outpatient Medications  Medication Sig Dispense Refill  . aspirin 81 MG tablet Take 81 mg by mouth daily.      . Multiple Vitamin (MULTIVITAMIN) tablet Take 1 tablet by mouth daily.       No current facility-administered medications for this visit.     Allergies:    Patient has no known allergies.   Social History:  The patient  reports that he has quit smoking. he has never used smokeless tobacco. He reports that he does not drink alcohol or use drugs.   Family History:  The patient's  family history includes Cancer in his father and mother; Heart disease in his paternal grandmother.    ROS:  Please see the history of present illness.   All other systems are reviewed and negative.    PHYSICAL EXAM: VS:  BP (!) 142/90   Pulse 82   Ht 5\' 10"  (1.778 m)   Wt 209 lb 12.8 oz (95.2 kg)   BMI 30.10 kg/m  , BMI Body mass index is 30.1 kg/m. GEN: Well nourished, well developed, in no acute distress  HEENT: normal  Neck: no JVD, no masses. No carotid bruits Cardiac: RRR without murmur or gallop                Respiratory:  clear to auscultation bilaterally, normal work of breathing GI: soft, nontender, nondistended, + BS MS: no deformity or atrophy  Ext: no pretibial edema, pedal pulses 2+= bilaterally Skin: warm and dry, no rash Neuro:  Strength and sensation are intact Psych: euthymic mood, full affect  EKG:  EKG is ordered today. The ekg ordered today shows normal sinus rhythm 82 bpm, left anterior fascicular block.  Recent Labs: No results found for requested labs within last 8760 hours.   Lipid  Panel     Component Value Date/Time   CHOL  02/12/2010 0520    189        ATP III CLASSIFICATION:  <200     mg/dL   Desirable  200-239  mg/dL   Borderline High  >=240    mg/dL   High          TRIG 78 02/12/2010 0520   HDL 46 02/12/2010 0520   CHOLHDL 4.1 02/12/2010 0520   VLDL 16 02/12/2010 0520   LDLCALC (H) 02/12/2010 0520    127        Total Cholesterol/HDL:CHD Risk Coronary Heart Disease Risk Table                     Men   Women  1/2 Average Risk   3.4   3.3  Average Risk       5.0   4.4  2 X Average Risk   9.6   7.1  3 X Average Risk  23.4   11.0        Use the calculated Patient Ratio above and the CHD Risk Table to  determine the patient's CHD Risk.        ATP III CLASSIFICATION (LDL):  <100     mg/dL   Optimal  100-129  mg/dL   Near or Above                    Optimal  130-159  mg/dL   Borderline  160-189  mg/dL   High  >190     mg/dL   Very High      Wt Readings from Last 3 Encounters:  04/04/17 209 lb 12.8 oz (95.2 kg)  09/07/15 203 lb 12.8 oz (92.4 kg)  07/25/14 195 lb (88.5 kg)     Cardiac Studies Reviewed: 2D echocardiogram 02/09/2016: Left ventricle:  The cavity size was normal. Wall thickness was increased in a pattern of moderate LVH. Systolic function was normal. The estimated ejection fraction was in the range of 60% to 65%. Wall motion was normal; there were no regional wall motion abnormalities. Features are consistent with a pseudonormal left ventricular filling pattern, with concomitant abnormal relaxation and increased filling pressure (grade 2 diastolic dysfunction).  ------------------------------------------------------------------- Aortic valve:   Structurally normal valve.   Cusp separation was normal.  Doppler:  Transvalvular velocity was within the normal range. There was no stenosis. There was trivial regurgitation.  ------------------------------------------------------------------- Aorta:  Aortic root: The aortic root was normal in size. Ascending aorta: The ascending aorta was normal in size.  ------------------------------------------------------------------- Mitral valve:   Structurally normal valve.   Leaflet separation was normal.  Doppler:  Transvalvular velocity was within the normal range. There was no evidence for stenosis. There was no regurgitation.    Peak gradient (D): 4 mm Hg.  ------------------------------------------------------------------- Left atrium:  The atrium was normal in size.  ------------------------------------------------------------------- Right ventricle:  The cavity size was normal. Systolic function  was normal.  ------------------------------------------------------------------- Pulmonic valve:    The valve appears to be grossly normal. Doppler:  There was no significant regurgitation.  ------------------------------------------------------------------- Tricuspid valve:   The valve appears to be grossly normal. Doppler:  There was mild regurgitation.  ------------------------------------------------------------------- Pulmonary artery:   Systolic pressure was within the normal range.   ------------------------------------------------------------------- Right atrium:  The atrium was normal in size.  ------------------------------------------------------------------- Pericardium:  There was no pericardial effusion.   ASSESSMENT AND PLAN: 62 year old male with ASD and severe pulmonic  insufficiency status post redo cardiac surgery with pulmonic valve replacement and ASD repair.  He is completely asymptomatic and doing well with a normal cardiac exam.  His echocardiogram from last year demonstrated normal LV and RV function as well as normal function of his bioprosthetic pulmonic valve.  He continues on aspirin 81 mg daily.  He understands the need to follow with lifelong SBE prophylaxis per guidelines.  I will see him back in 1 year.  Current medicines are reviewed with the patient today.  The patient does not have concerns regarding medicines.  Labs/ tests ordered today include:   Orders Placed This Encounter  Procedures  . EKG 12-Lead    Disposition:   FU one year  Signed, [name], MD  04/04/2017 5:19 PM    [facility] [place], [place], [place]  [address] Phone: [phone]; Fax: [phone]

## 2017-05-29 ENCOUNTER — Encounter: Payer: Self-pay | Admitting: Internal Medicine

## 2018-03-18 ENCOUNTER — Encounter: Payer: Self-pay | Admitting: Physician Assistant

## 2018-04-06 ENCOUNTER — Encounter: Payer: Self-pay | Admitting: Physician Assistant

## 2018-04-06 ENCOUNTER — Ambulatory Visit (INDEPENDENT_AMBULATORY_CARE_PROVIDER_SITE_OTHER): Payer: BLUE CROSS/BLUE SHIELD | Admitting: Physician Assistant

## 2018-04-06 VITALS — BP 120/62 | HR 85 | Ht 70.0 in | Wt 205.0 lb

## 2018-04-06 DIAGNOSIS — I379 Nonrheumatic pulmonary valve disorder, unspecified: Secondary | ICD-10-CM | POA: Diagnosis not present

## 2018-04-06 DIAGNOSIS — Q2111 Secundum atrial septal defect: Secondary | ICD-10-CM

## 2018-04-06 DIAGNOSIS — Q211 Atrial septal defect: Secondary | ICD-10-CM

## 2018-04-06 NOTE — Patient Instructions (Addendum)
Medication Instructions:  No changes  If you need a refill on your cardiac medications before your next appointment, please call your pharmacy.   Lab work: None   If you have labs (blood work) drawn today and your tests are completely normal, you will receive your results only by: [name] MyChart Message (if you have MyChart) OR . A paper copy in the mail If you have any lab test that is abnormal or we need to change your treatment, we will call you to review the results.  Testing/Procedures: None   Follow-Up: At [facility], you and your health needs are our priority.  As part of our continuing mission to provide you with exceptional heart care, we have created designated Provider Care Teams.  These Care Teams include your primary Cardiologist (physician) and Advanced Practice Providers (APPs -  Physician Assistants and Nurse Practitioners) who all work together to provide you with the care you need, when you need it. You will need a follow up appointment in:  12 months.  Please call our office 2 months in advance to schedule this appointment.  You may see Dr. Burt Knack  or one of the following Advanced Practice Providers on your designated Care Team: Richardson Dopp, PA-C [place], [place] . Daune Perch, NP  Any Other Special Instructions Will Be Listed Below (If Applicable). If you meet your deductible prior to the end of the year, call and we can order your echocardiogram at that time. Or, we can plan on getting your echocardiogram in 07/2019, after you are on Medicare.

## 2018-04-06 NOTE — Progress Notes (Signed)
Cardiology Office Note:    Date:  04/06/2018   ID:  LILIANA BRENTLINGER, DOB 01-Aug-1955, MRN [id number]  PCP:  [name], MD  Cardiologist:  [name], MD   Electrophysiologist:  None   Referring MD: [name], MD   Chief Complaint  Patient presents with  . Follow-up    Hx of ASD repair and Pulmonic Valve replacement      History of Present Illness:    Gavin Owens is a 63 y.o. male with atrial septal defect and pulmonic valve disease, prior TIA.  He had surgery as a young child and later underwent Amplatzer septal occluder placement to treat residual ASD.  He then developed progressive RV enlargement and severe pulmonic valve insufficiency and residual atrial level shunting.  In 2016, he underwent redo median sternotomy, removal of the Amplatzer atrial septal occluder, ASD repair with Cardiocel patch, replacement of the pulmonic valve with a 27 mm St. Jude Medical trifecta bioprosthetic valve and patch augmentation of the main pulmonary artery at [facility] with Dr. Juel Burrow.  He was last seen by Dr. Burt Knack in March 2019.  Mr. Miskell returns for follow up.  He is here with his wife.  He is doing well without chest pain, shortness of breath, syncope, paroxysmal nocturnal dyspnea, significant leg swelling.  He wears compression stockings.  Prior CV studies:   The following studies were reviewed today:  Echo 02/09/16 Mod LVH, EF 60-65, no RWMA, Gr 2 DD, trivial AI, normal RVSF, mild TR  Cardiac MRI 08/18/14 (prior to ASD repair and TV replacement) IMPRESSION: 1) Moderate to severe RV dilatation with preserved EF 59% suggesting volume overload from residual ASD shunt and PR 2) Normal LV size and function EF 54% 3) Normal pulmonary veins 4) Mild RAE 5) Dilated IVC 6) Moderate appearing pulmonary regurgitations. Holodiastolic but estimated RF 35% (validity in setting of atrial shunt and TR questionable) and visually more moderate as well 7) Atrial septal occluder device with  residual shunting? Through device and at aortic rim 8) Significantly dilated MPA and LPA  Past Medical History:  Diagnosis Date  . Hiatal hernia   . Hypercholesteremia   . Internal hemorrhoids   . Migraines    Ocular  . MVP (mitral valve prolapse)    Surgical Hx: The patient  has a past surgical history that includes Cardiac surgery (1960) and TEE without cardioversion (N/A, 08/01/2014).   Current Medications: Current Meds  Medication Sig  . aspirin 81 MG tablet Take 81 mg by mouth daily.    . Multiple Vitamin (MULTIVITAMIN) tablet Take 1 tablet by mouth daily.    [name] triamterene-hydrochlorothiazide (MAXZIDE-25) 37.5-25 MG tablet Take 1 tablet by mouth daily.     Allergies:   Patient has no known allergies.   Social History   Tobacco Use  . Smoking status: Former Research scientist (life sciences)  . Smokeless tobacco: Never Used  Substance Use Topics  . Alcohol use: No  . Drug use: No     Family Hx: The patient's family history includes Cancer in his father and mother; Heart disease in his paternal grandmother.  ROS:   Please see the history of present illness.    ROS All other systems reviewed and are negative.   EKGs/Labs/Other Test Reviewed:    EKG:  EKG is  ordered today.  The ekg ordered today demonstrates normal sinus rhythm, HR 85, RAD, inc RBBB, QTc 430, no change from prior ECG  Recent Labs: No results found for requested labs within last  8760 hours.     Physical Exam:    VS:  BP 120/62   Pulse 85   Ht 5\' 10"  (1.778 m)   Wt 205 lb (93 kg)   SpO2 97%   BMI 29.41 kg/m     Wt Readings from Last 3 Encounters:  04/06/18 205 lb (93 kg)  04/04/17 209 lb 12.8 oz (95.2 kg)  09/07/15 203 lb 12.8 oz (92.4 kg)     Physical Exam  Constitutional: He is oriented to person, place, and time. He appears well-developed and well-nourished. No distress.  HENT:  Head: Normocephalic and atraumatic.  Eyes: No scleral icterus.  Neck: No JVD present.  Cardiovascular: Normal rate and  regular rhythm.  No murmur heard. Pulmonary/Chest: Effort normal. He has no rales.  Abdominal: Soft. He exhibits no distension.  Musculoskeletal:        General: No edema.  Neurological: He is alert and oriented to person, place, and time.  Skin: Skin is warm and dry.    ASSESSMENT & PLAN:    ASD (atrial septal defect), ostium secundum / Pulmonary valve disease He is s/p ASD repair and pulmonic valve replacement in 2016.  He is overall stable without any significant symptoms.  We discussed proceeding with a follow up echocardiogram now.  However, his insurance does not pay very well for this.  Therefore, we can get it at the end of the year when he meets his deductible or wait until he starts Medicare in 07/2019.  Continue SBE prophylaxis.  FU with Dr. Burt Knack in 1 year.    Dispo:  No follow-ups on file.   Medication Adjustments/Labs and Tests Ordered: Current medicines are reviewed at length with the patient today.  Concerns regarding medicines are outlined above.  Tests Ordered: Orders Placed This Encounter  Procedures  . EKG 12-Lead   Medication Changes: No orders of the defined types were placed in this encounter.   Signed, Richardson Dopp, PA-C  04/06/2018 4:31 PM    [facility] [place], [place], [place]  [address] Phone: [phone]; Fax: [phone]

## 2018-07-08 ENCOUNTER — Encounter: Payer: Self-pay | Admitting: Internal Medicine

## 2018-07-23 ENCOUNTER — Other Ambulatory Visit: Payer: Self-pay

## 2018-07-23 ENCOUNTER — Ambulatory Visit: Payer: BC Managed Care – PPO

## 2018-07-23 VITALS — Ht 70.0 in | Wt 197.0 lb

## 2018-07-23 DIAGNOSIS — Z1211 Encounter for screening for malignant neoplasm of colon: Secondary | ICD-10-CM

## 2018-07-23 NOTE — Progress Notes (Signed)
No egg or soy allergy known to patient  No issues with past sedation with any surgeries  or procedures, no intubation problems  No diet pills per patient No home 02 use per patient  No blood thinners per patient  Pt denies issues with constipation  No A fib or A flutter  EMMI video sent to pt's e mail , pt declined    

## 2018-07-31 ENCOUNTER — Telehealth: Payer: Self-pay | Admitting: Internal Medicine

## 2018-07-31 NOTE — Telephone Encounter (Signed)

## 2018-08-03 ENCOUNTER — Ambulatory Visit (AMBULATORY_SURGERY_CENTER): Payer: BC Managed Care – PPO | Admitting: Internal Medicine

## 2018-08-03 ENCOUNTER — Other Ambulatory Visit: Payer: Self-pay

## 2018-08-03 ENCOUNTER — Encounter: Payer: Self-pay | Admitting: Internal Medicine

## 2018-08-03 VITALS — BP 122/80 | HR 90 | Temp 98.3°F | Resp 12 | Ht 70.0 in | Wt 197.0 lb

## 2018-08-03 DIAGNOSIS — K635 Polyp of colon: Secondary | ICD-10-CM

## 2018-08-03 DIAGNOSIS — Z1211 Encounter for screening for malignant neoplasm of colon: Secondary | ICD-10-CM

## 2018-08-03 DIAGNOSIS — D123 Benign neoplasm of transverse colon: Secondary | ICD-10-CM

## 2018-08-03 MED ORDER — SODIUM CHLORIDE 0.9 % IV SOLN: 500.0000 mL | Freq: Once | INTRAVENOUS | Status: DC

## 2018-08-03 NOTE — Progress Notes (Signed)
Called to room to assist during endoscopic procedure.  Patient ID and intended procedure confirmed with present staff. Received instructions for my participation in the procedure from the performing physician.  

## 2018-08-03 NOTE — Op Note (Signed)
[facility] Patient Name: Gavin Owens Procedure Date: 08/03/2018 8:41 AM MRN: [id number] Endoscopist: Gatha Mayer , MD Age: 63 Referring MD:  Date of Birth: August 23, 1955 Gender: Male Account #: [id number] Procedure:                Colonoscopy Indications:              Screening for colorectal malignant neoplasm Medicines:                Propofol per Anesthesia, Monitored Anesthesia Care Procedure:                Pre-Anesthesia Assessment:                           - Prior to the procedure, a History and Physical                            was performed, and patient medications and                            allergies were reviewed. The patient's tolerance of                            previous anesthesia was also reviewed. The risks                            and benefits of the procedure and the sedation                            options and risks were discussed with the patient.                            All questions were answered, and informed consent                            was obtained. Prior Anticoagulants: The patient has                            taken no previous anticoagulant or antiplatelet                            agents. ASA Grade Assessment: II - A patient with                            mild systemic disease. After reviewing the risks                            and benefits, the patient was deemed in                            satisfactory condition to undergo the procedure.                           After obtaining informed consent, the colonoscope  was passed under direct vision. Throughout the                            procedure, the patient's blood pressure, pulse, and                            oxygen saturations were monitored continuously. The                            Model CF-HQ190L [id number]) scope was introduced                            through the anus and advanced to the the cecum,      identified by appendiceal orifice and ileocecal                            valve. The colonoscopy was performed without                            difficulty. The patient tolerated the procedure                            well. The quality of the bowel preparation was                            excellent. The ileocecal valve, appendiceal                            orifice, and rectum were photographed. The bowel                            preparation used was Miralax via split dose                            instruction. Scope In: 8:54:49 AM Scope Out: 9:12:10 AM Scope Withdrawal Time: 0 hours 13 minutes 3 seconds  Total Procedure Duration: 0 hours 17 minutes 21 seconds  Findings:                 The perianal and digital rectal examinations were                            normal. Pertinent negatives include normal prostate                            (size, shape, and consistency).                           A 1 to 2 mm polyp was found in the transverse                            colon. The polyp was sessile. The polyp was removed                            with  a cold biopsy forceps. Resection and retrieval                            were complete. Verification of patient                            identification for the specimen was done. Estimated                            blood loss was minimal.                           The exam was otherwise without abnormality on                            direct and retroflexion views. Complications:            No immediate complications. Estimated Blood Loss:     Estimated blood loss was minimal. Impression:               - One 1 to 2 mm polyp in the transverse colon,                            removed with a cold biopsy forceps. Resected and                            retrieved. Recommendation:           - Patient has a contact number available for                            emergencies. The signs and symptoms of potential                             delayed complications were discussed with the                            patient. Return to normal activities tomorrow.                            Written discharge instructions were provided to the                            patient.                           - Resume previous diet.                           - Continue present medications.                           - Repeat colonoscopy is recommended for                            surveillance. The colonoscopy date will be  determined after pathology results from today's                            exam become available for review. Gatha Mayer, MD 08/03/2018 9:16:53 AM This report has been signed electronically.

## 2018-08-03 NOTE — Progress Notes (Signed)
PT taken to PACU. Monitors in place. VSS. Report given to RN. 

## 2018-08-03 NOTE — Patient Instructions (Addendum)
I found and removed one tiny polyp.  I will let you know pathology results and when to have another routine colonoscopy by mail and/or My Chart.  I appreciate the opportunity to care for you. Gatha Mayer, MD, FACG   YOU HAD AN ENDOSCOPIC PROCEDURE TODAY AT [facility]:   Refer to the procedure report that was given to you for any specific questions about what was found during the examination.  If the procedure report does not answer your questions, please call your gastroenterologist to clarify.  If you requested that your care partner not be given the details of your procedure findings, then the procedure report has been included in a sealed envelope for you to review at your convenience later.  YOU SHOULD EXPECT: Some feelings of bloating in the abdomen. Passage of more gas than usual.  Walking can help get rid of the air that was put into your GI tract during the procedure and reduce the bloating. If you had a lower endoscopy (such as a colonoscopy or flexible sigmoidoscopy) you may notice spotting of blood in your stool or on the toilet paper. If you underwent a bowel prep for your procedure, you may not have a normal bowel movement for a few days.  Please Note:  You might notice some irritation and congestion in your nose or some drainage.  This is from the oxygen used during your procedure.  There is no need for concern and it should clear up in a day or so.  SYMPTOMS TO REPORT IMMEDIATELY:   Following lower endoscopy (colonoscopy or flexible sigmoidoscopy):  Excessive amounts of blood in the stool  Significant tenderness or worsening of abdominal pains  Swelling of the abdomen that is new, acute  Fever of 100F or higher   For urgent or emergent issues, a gastroenterologist can be reached at any hour by calling [phone].   DIET:  We do recommend a small meal at first, but then you may proceed to your regular diet.  Drink plenty of fluids but you  should avoid alcoholic beverages for 24 hours.  ACTIVITY:  You should plan to take it easy for the rest of today and you should NOT DRIVE or use heavy machinery until tomorrow (because of the sedation medicines used during the test).    FOLLOW UP: Our staff will call the number listed on your records 48-72 hours following your procedure to check on you and address any questions or concerns that you may have regarding the information given to you following your procedure. If we do not reach you, we will leave a message.  We will attempt to reach you two times.  During this call, we will ask if you have developed any symptoms of COVID 19. If you develop any symptoms (ie: fever, flu-like symptoms, shortness of breath, cough etc.) before then, please call [phone].  If you test positive for Covid 19 in the 2 weeks post procedure, please call and report this information to [place].    If any biopsies were taken you will be contacted by phone or by letter within the next 1-3 weeks.  Please call us at [phone] if you have not heard about the biopsies in 3 weeks.    SIGNATURES/CONFIDENTIALITY: You and/or your care partner have signed paperwork which will be entered into your electronic medical record.  These signatures attest to the fact that that the information above on your After Visit Summary has been reviewed and is  understood.  Full responsibility of the confidentiality of this discharge information lies with you and/or your care-partner. 

## 2018-08-03 NOTE — Progress Notes (Signed)
Pt's states no medical or surgical changes since previsit or office visit. 

## 2018-08-05 ENCOUNTER — Telehealth: Payer: Self-pay | Admitting: *Deleted

## 2018-08-05 ENCOUNTER — Encounter: Payer: Self-pay | Admitting: Internal Medicine

## 2018-08-05 ENCOUNTER — Telehealth: Payer: Self-pay

## 2018-08-05 NOTE — Progress Notes (Signed)
Benign mucosal colon polyp 2030 colonoscopy recall My chart

## 2018-08-05 NOTE — Telephone Encounter (Signed)
First post procedure follow up call, no answer 

## 2018-08-05 NOTE — Telephone Encounter (Signed)
  Follow up Call-  Call back number 08/03/2018  Post procedure Call Back phone  # [phone]  Permission to leave phone message Yes  Some recent data might be hidden     Patient questions:  Do you have a fever, pain , or abdominal swelling? No. Pain Score  0 *  Have you tolerated food without any problems? Yes.    Have you been able to return to your normal activities? Yes.    Do you have any questions about your discharge instructions: Diet   No. Medications  No. Follow up visit  No.  Do you have questions or concerns about your Care? No.  Actions: * If pain score is 4 or above: No action needed, pain <4.   1. Have you developed a fever since your procedure? no  2.   Have you had an respiratory symptoms (SOB or cough) since your procedure? no  3.   Have you tested positive for COVID 19 since your procedure no  4.   Have you had any family members/close contacts diagnosed with the COVID 19 since your procedure?  no   If yes to any of these questions please route to Joylene John, RN and Alphonsa Gin, Therapist, sports.

## 2019-04-11 ENCOUNTER — Ambulatory Visit: Payer: BC Managed Care – PPO | Attending: Internal Medicine

## 2019-04-11 DIAGNOSIS — Z23 Encounter for immunization: Secondary | ICD-10-CM | POA: Insufficient documentation

## 2019-04-11 NOTE — Progress Notes (Signed)
   Covid-19 Vaccination Clinic  Name:  BERL BAUMAN    MRN: [id number] DOB: [birth year]  04/11/2019  Mr. Copp was observed post Covid-19 immunization for 15 minutes without incident. He was provided with Vaccine Information Sheet and instruction to access the V-Safe system.   Mr. Zelenak was instructed to call 911 with any severe reactions post vaccine: [name] Difficulty breathing  . Swelling of face and throat  . A fast heartbeat  . A bad rash all over body  . Dizziness and weakness   Immunizations Administered    Name Date Dose VIS Date Route   Pfizer COVID-19 Vaccine 04/11/2019  2:59 PM 0.3 mL 01/15/2019 Intramuscular   Manufacturer: [place]   Lot: [id number]   [place]: [id number]

## 2019-05-11 ENCOUNTER — Ambulatory Visit: Payer: BC Managed Care – PPO | Attending: Internal Medicine

## 2019-05-11 DIAGNOSIS — Z23 Encounter for immunization: Secondary | ICD-10-CM

## 2019-05-11 NOTE — Progress Notes (Signed)
   Covid-19 Vaccination Clinic  Name:  CALTON ARCIDIACONO    MRN: [id number] DOB: 01-13-1956  05/11/2019  Mr. Schmith was observed post Covid-19 immunization for 15 minutes without incident. He was provided with Vaccine Information Sheet and instruction to access the V-Safe system.   Mr. Bacigalupi was instructed to call 911 with any severe reactions post vaccine: [name] Difficulty breathing  . Swelling of face and throat  . A fast heartbeat  . A bad rash all over body  . Dizziness and weakness   Immunizations Administered    Name Date Dose VIS Date Route   Pfizer COVID-19 Vaccine 05/11/2019  3:25 PM 0.3 mL 01/15/2019 Intramuscular   Manufacturer: [place]   Lot: [id number]   [place]: [id number]

## 2019-11-18 DIAGNOSIS — J069 Acute upper respiratory infection, unspecified: Secondary | ICD-10-CM | POA: Diagnosis not present

## 2019-11-18 DIAGNOSIS — Z20822 Contact with and (suspected) exposure to covid-19: Secondary | ICD-10-CM | POA: Diagnosis not present

## 2019-11-18 DIAGNOSIS — U071 COVID-19: Secondary | ICD-10-CM | POA: Diagnosis not present

## 2019-11-24 DIAGNOSIS — Z20822 Contact with and (suspected) exposure to covid-19: Secondary | ICD-10-CM | POA: Diagnosis not present

## 2020-02-09 DIAGNOSIS — Z0000 Encounter for general adult medical examination without abnormal findings: Secondary | ICD-10-CM | POA: Diagnosis not present

## 2020-02-09 DIAGNOSIS — I10 Essential (primary) hypertension: Secondary | ICD-10-CM | POA: Diagnosis not present

## 2020-02-14 DIAGNOSIS — Z8673 Personal history of transient ischemic attack (TIA), and cerebral infarction without residual deficits: Secondary | ICD-10-CM | POA: Diagnosis not present

## 2020-02-14 DIAGNOSIS — Z8616 Personal history of COVID-19: Secondary | ICD-10-CM | POA: Diagnosis not present

## 2020-02-14 DIAGNOSIS — Z7982 Long term (current) use of aspirin: Secondary | ICD-10-CM | POA: Diagnosis not present

## 2020-02-14 DIAGNOSIS — H6122 Impacted cerumen, left ear: Secondary | ICD-10-CM | POA: Diagnosis not present

## 2020-02-14 DIAGNOSIS — Z0000 Encounter for general adult medical examination without abnormal findings: Secondary | ICD-10-CM | POA: Diagnosis not present

## 2020-02-14 DIAGNOSIS — G4700 Insomnia, unspecified: Secondary | ICD-10-CM | POA: Diagnosis not present

## 2020-02-14 DIAGNOSIS — I10 Essential (primary) hypertension: Secondary | ICD-10-CM | POA: Diagnosis not present

## 2020-02-14 DIAGNOSIS — Z87891 Personal history of nicotine dependence: Secondary | ICD-10-CM | POA: Diagnosis not present

## 2020-02-14 DIAGNOSIS — Z23 Encounter for immunization: Secondary | ICD-10-CM | POA: Diagnosis not present

## 2020-02-14 DIAGNOSIS — I517 Cardiomegaly: Secondary | ICD-10-CM | POA: Diagnosis not present

## 2020-03-03 DIAGNOSIS — Z7982 Long term (current) use of aspirin: Secondary | ICD-10-CM | POA: Diagnosis not present

## 2020-03-03 DIAGNOSIS — I10 Essential (primary) hypertension: Secondary | ICD-10-CM | POA: Diagnosis not present

## 2020-03-09 DIAGNOSIS — J929 Pleural plaque without asbestos: Secondary | ICD-10-CM | POA: Diagnosis not present

## 2020-07-18 ENCOUNTER — Ambulatory Visit: Payer: BC Managed Care – PPO | Admitting: Internal Medicine

## 2020-07-18 ENCOUNTER — Encounter: Payer: Self-pay | Admitting: Internal Medicine

## 2020-07-18 ENCOUNTER — Other Ambulatory Visit: Payer: Self-pay

## 2020-07-18 VITALS — BP 114/72 | HR 84 | Ht 70.0 in | Wt 183.2 lb

## 2020-07-18 DIAGNOSIS — J929 Pleural plaque without asbestos: Secondary | ICD-10-CM

## 2020-07-18 DIAGNOSIS — R9389 Abnormal findings on diagnostic imaging of other specified body structures: Secondary | ICD-10-CM | POA: Diagnosis not present

## 2020-07-18 DIAGNOSIS — Z8616 Personal history of COVID-19: Secondary | ICD-10-CM

## 2020-07-18 NOTE — Progress Notes (Signed)
OV 07/18/2020  Subjective:  Patient ID: Gavin Owens, male , DOB: 03-05-1955 , age 65 y.o. , MRN: [id number] , ADDRESS: [address] [place] [address] PCP [name], MD Patient Care Team: [name], MD as PCP - General (Internal Medicine) [name], MD as PCP - Cardiology (Cardiology)  This Provider for this visit: Treatment Team:  Attending Provider: Brand Males, MD    07/18/2020 -   Chief Complaint  Patient presents with   Consult    Pt is being referred by PCP due to pleural plaque seen on imaging. Pt denies any complaints of cough, SOB, or chest discomfort.     HPI Gavin Owens 65 y.o. -presents with his wife.  Referred by Dr. [name] for abnormal CT scan of the chest.  Patient has a history of congenital heart valve issues and has had open heart surgery twice in his life including the first 1 in the 1960s.  He tells me that he is completely asymptomatic from a respiratory standpoint.  He is here with his wife Gavin Owens.  He had a coronary CT scan of the chest.  He brought the disc with him.  I was personally able to visualize this.  This was done on 03/03/2020.  The official report is that there is very small left-sided pleural effusion or pleural thickening associated with irregular mostly linear opacities in the left lung suggesting chronic scarring or atelectasis.  Pleural-based calcification possibly related to previous trauma or asbestos exposure.  There is also small area of consolidation in the left lung base with a nodular component measuring 2.7 cm representing chronic atelectasis.  No personal visualization is able to confirm these findings except the nodular component.  It was a very hard scan to visualize.  This is a coronary calcium CT scan.  He tells me that in October 2020 when he had COVID-19 delta variant likely.  But no other history of pneumonia motor vehicle accident or trauma.  He has had cardiac open heart surgery.  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  There is no Architect work no Teacher, music work.  No brake lining no shipyard work no asbestos fumes exposure no welding.    CT Chest data  No results found.    PFT  No flowsheet data found.     has a past medical history of Hiatal hernia, Hypercholesteremia, Hypertension, Internal hemorrhoids, Migraines, MVP (mitral valve prolapse), and TIA (transient ischemic attack) (2012).   reports that he quit smoking about 45 years ago. His smoking use included cigarettes. He has a 7.00 pack-year smoking history. He has never used smokeless tobacco.  Past Surgical History:  Procedure Laterality Date   CARDIAC SURGERY  1960   Congenital heart defect   COLONOSCOPY     TEE WITHOUT CARDIOVERSION N/A 08/01/2014   Procedure: TRANSESOPHAGEAL ECHOCARDIOGRAM (TEE);  Surgeon: Sanda Klein, MD;  [facility]: [facility] END[facility]COPY;  Service: Cardiovascular;  Laterality: N/A;    No Known Allergies  Immunization History  Administered Date(s) Administered   Influenza, High Dose Seasonal PF 10/17/2019   PFIZER(Purple Top)SARS-COV-2 Vaccination 04/11/2019, 05/11/2019   PNEUMOCOCCAL CONJUGATE-20 02/14/2020   Tdap 08/13/2012   Zoster Recombinat (Shingrix) 02/08/2017, 07/17/2017, 11/11/2017   Zoster, Live 11/30/2013    Family History  Problem Relation Age of Onset   Cancer Mother    Cancer Father        Limphoma  Heart disease Paternal Grandmother    Colon cancer Neg Hx    Esophageal cancer Neg Hx    Rectal cancer Neg Hx    Stomach cancer Neg Hx      Current Outpatient Medications:    acetaminophen (TYLENOL) 325 MG tablet, 1 tablet as needed, Disp: , Rfl:    aspirin 81 MG tablet, Take 81 mg by mouth daily.  , Disp: , Rfl:    Multiple Vitamin (MULTIVITAMIN) tablet, Take 1 tablet by mouth daily.  , Disp: , Rfl:    triamterene-hydrochlorothiazide (MAXZIDE-25) 37.5-25 MG tablet, Take 1 tablet by mouth daily.,  Disp: , Rfl:    zaleplon (SONATA) 10 MG capsule, TAKE 1 CAPSULE BY MOUTH EVERY DAY AT BEDTIME AS NEEDED, Disp: , Rfl:       Objective:   Vitals:   07/18/20 1010  BP: 114/72  Pulse: 84  SpO2: 98%  Weight: 183 lb 3.2 oz (83.1 kg)  Height: 5\' 10"  (1.778 m)    Estimated body mass index is 26.29 kg/m as calculated from the following:   Height as of this encounter: 5\' 10"  (1.778 m).   Weight as of this encounter: 183 lb 3.2 oz (83.1 kg).  @WEIGHTCHANGE @  [employer]   07/18/20 1010  Weight: 183 lb 3.2 oz (83.1 kg)     Physical Exam  General Appearance:    Alert, cooperative, no distress, appears stated age - yes , Deconditioned looking - no , OBESE  - no, Sitting on Wheelchair -  no  Head:    Normocephalic, without obvious abnormality, atraumatic  Eyes:    PERRL, conjunctiva/corneas clear,  Ears:    Normal TM's and external ear canals, both ears  Nose:   Nares normal, septum midline, mucosa normal, no drainage    or sinus tenderness. OXYGEN ON  - no . Patient is @ ra   Throat:   Lips, mucosa, and tongue normal; teeth and gums normal. Cyanosis on lips - no  Neck:   Supple, symmetrical, trachea midline, no adenopathy;    thyroid:  no enlargement/tenderness/nodules; no carotid   bruit or JVD  Back:     Symmetric, no curvature, ROM normal, no CVA tenderness  Lungs:     Distress - no , Wheeze no, Barrell Chest - no, Purse lip breathing - no, Crackles - no . HAs KELOID on central chest   Chest Wall:    No tenderness or deformity.    Heart:    Regular rate and rhythm, S1 and S2 normal, no rub   or gallop, Murmur - no  Breast Exam:    NOT DONE  Abdomen:     Soft, non-tender, bowel sounds active all four quadrants,    no masses, no organomegaly. Visceral obesity - no  Genitalia:   NOT DONE  Rectal:   NOT DONE  Extremities:   Extremities - normal, Has Cane - no, Clubbing - no, Edema - no  Pulses:   2+ and symmetric all extremities  Skin:   Stigmata of Connective Tissue Disease -  no  Lymph nodes:   Cervical, supraclavicular, and axillary nodes normal  Psychiatric:  Neurologic:   Pleasant - yes, Anxious - no, Flat affect - no  CAm-ICU - neg, Alert and Oriented x 3 - yes, Moves all 4s - yes, Speech - normal, Cognition - intact          Assessment:       ICD-10-CM   1. Personal history of COVID-19  Z86.16  2. Abnormal CT of the chest  R93.89     3. Pleural thickening  J92.9          Plan:     Patient Instructions     ICD-10-CM   1. Personal history of COVID-19  Z86.16     2. Abnormal CT of the chest  R93.89     3. Pleural thickening  J92.9       Unclear nature of findings on CT scan of heart Noted you are asymptomatic from chest stand point  Plan  HRCT supine and prone -   Followup  -telephone visit with Dr Gavin Owens or APP next few to several weeks to discuss results and next steps    SIGNATURE    Dr. Brand Males, M.D., F.C.C.P,  Pulmonary and Critical Care Medicine Staff Physician, [place] Director - Interstitial Lung Disease  Program  Pulmonary [place] at [place], [place], [address]  Pager: [phone], If no answer or between  15:00h - 7:00h: call [phone] Telephone: [phone]  10:39 AM 07/18/2020

## 2020-07-18 NOTE — Patient Instructions (Signed)
ICD-10-CM   1. Personal history of COVID-19  Z86.16     2. Abnormal CT of the chest  R93.89     3. Pleural thickening  J92.9       Unclear nature of findings on CT scan of heart Noted you are asymptomatic from chest stand point  Plan  HRCT supine and prone -   Followup  -telephone visit with Dr Chase Caller or APP next few to several weeks to discuss results and next steps

## 2020-07-24 ENCOUNTER — Other Ambulatory Visit: Payer: Self-pay

## 2020-07-24 ENCOUNTER — Ambulatory Visit (INDEPENDENT_AMBULATORY_CARE_PROVIDER_SITE_OTHER): Payer: Medicare Other

## 2020-07-24 DIAGNOSIS — R9389 Abnormal findings on diagnostic imaging of other specified body structures: Secondary | ICD-10-CM

## 2020-07-24 DIAGNOSIS — Z8709 Personal history of other diseases of the respiratory system: Secondary | ICD-10-CM | POA: Diagnosis not present

## 2020-07-24 DIAGNOSIS — Z8616 Personal history of COVID-19: Secondary | ICD-10-CM | POA: Diagnosis not present

## 2020-07-24 DIAGNOSIS — K449 Diaphragmatic hernia without obstruction or gangrene: Secondary | ICD-10-CM | POA: Diagnosis not present

## 2020-07-24 DIAGNOSIS — J929 Pleural plaque without asbestos: Secondary | ICD-10-CM | POA: Diagnosis not present

## 2020-07-24 DIAGNOSIS — Z952 Presence of prosthetic heart valve: Secondary | ICD-10-CM | POA: Diagnosis not present

## 2020-07-30 NOTE — Progress Notes (Signed)
IMPRESSION: Trace left pleural effusion and/or pleural thickening with associated bandlike scarring and atelectasis. This is presumably postoperative following median sternotomy. No further follow-up is required.   Electronically Signed   By: Eddie Candle M.D.   On: 07/24/2020 15:27 xxx Will discuss 09/12/20 tele visit

## 2020-09-12 ENCOUNTER — Ambulatory Visit (INDEPENDENT_AMBULATORY_CARE_PROVIDER_SITE_OTHER): Payer: Medicare Other | Admitting: Internal Medicine

## 2020-09-12 ENCOUNTER — Other Ambulatory Visit: Payer: Self-pay

## 2020-09-12 DIAGNOSIS — Z8616 Personal history of COVID-19: Secondary | ICD-10-CM | POA: Diagnosis not present

## 2020-09-12 DIAGNOSIS — J929 Pleural plaque without asbestos: Secondary | ICD-10-CM

## 2020-09-12 DIAGNOSIS — K449 Diaphragmatic hernia without obstruction or gangrene: Secondary | ICD-10-CM | POA: Diagnosis not present

## 2020-09-12 NOTE — Progress Notes (Signed)
OV 07/18/2020  Subjective:  Patient ID: Gavin Owens, male , DOB: 1955/08/08 , age 65 y.o. , MRN: [id number] , ADDRESS: [address] [place] [address] PCP [name], MD Patient Care Team: [name], MD as PCP - General (Internal Medicine) [name], MD as PCP - Cardiology (Cardiology)  This Provider for this visit: Treatment Team:  Attending Provider: Brand Males, MD    07/18/2020 -   Chief Complaint  Patient presents with   Consult    Pt is being referred by PCP due to pleural plaque seen on imaging. Pt denies any complaints of cough, SOB, or chest discomfort.     HPI Gavin Owens 65 y.o. -presents with his wife.  Referred by Dr. [name] for abnormal CT scan of the chest.  Patient has a history of congenital heart valve issues and has had open heart surgery twice in his life including the first 1 in the 1960s.  He tells me that he is completely asymptomatic from a respiratory standpoint.  He is here with his wife [name].  He had a coronary CT scan of the chest.  He brought the disc with him.  I was personally able to visualize this.  This was done on 03/03/2020.  The official report is that there is very small left-sided pleural effusion or pleural thickening associated with irregular mostly linear opacities in the left lung suggesting chronic scarring or atelectasis.  Pleural-based calcification possibly related to previous trauma or asbestos exposure.  There is also small area of consolidation in the left lung base with a nodular component measuring 2.7 cm representing chronic atelectasis.  No personal visualization is able to confirm these findings except the nodular component.  It was a very hard scan to visualize.  This is a coronary calcium CT scan.  He tells me that in October 2020 when he had COVID-19 delta variant likely.  But no other history of pneumonia motor vehicle accident or trauma.  He has had cardiac open heart surgery.  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  There is no Architect work no Teacher, music work.  No brake lining no shipyard work no asbestos fumes exposure no welding.    CT Chest data  No results found.    PFT  No flowsheet data found.   OV 09/12/2020  Subjective:  Patient ID: Gavin Owens, male , DOB: 1955/09/17 , age 65 y.o. , MRN: [id number] , ADDRESS: [address] [place] [address] PCP [name], MD Patient Care Team: [name], MD as PCP - General (Internal Medicine) [name], MD as PCP - Cardiology (Cardiology)  This Provider for this visit: Treatment Team:  Attending Provider: Brand Males, MD  Type of visit: Telephone/Video Circumstance: COVID-19 national emergency Identification of patient Gavin Owens with 03/04/1955 and MRN [id number] - 2 person identifier Risks: Risks, benefits, limitations of telephone visit explained. Patient understood and verbalized agreement to proceed Anyone else on call: none Patient location: cell phone This provider location: [place]. in [place] pulmonary office   09/12/2020 -follow-up abnormal CT scan of the chest.  His previous CT scan of the chest was low quality so we did a high-resolution CT chest and he is here to follow-up the results.  He has some pleural thickening trace pleural effusion particularly on the left side.  I personally visualized this and the features are  consistent with previous median sternotomy.  He continues to be asymptomatic.  There is no evidence of pulmonary fibrosis.  No lung cancer.  No emphysema no pneumonia.  There is a small hiatal hernia I visualized this as well.  He tells me that he only has occasional acid reflux and overall he is pretty good.  We discussed that the follow-up could be expectant versus having a telephone visit in 1 year.  He decided to have expectant follow-up.  Radiology does not think he requires  regular follow-up   HPI Gavin Owens 65 y.o. -  CT chest 07/24/20  Narrative & Impression  CLINICAL DATA:  Pleural thickening seen on prior CT   EXAM: CT CHEST WITHOUT CONTRAST   TECHNIQUE: Multidetector CT imaging of the chest was performed following the standard protocol without intravenous contrast. High resolution imaging of the lungs, as well as inspiratory and expiratory imaging, was performed.   COMPARISON:  None.   FINDINGS: Cardiovascular: Status post median sternotomy. Normal heart size. Pulmonary valve prosthesis. No pericardial effusion.   Mediastinum/Nodes: No enlarged mediastinal, hilar, or axillary lymph nodes. Small hiatal hernia. Thyroid gland, trachea, and esophagus demonstrate no significant findings.   Lungs/Pleura: Trace left pleural effusion and/or pleural thickening with associated bandlike scarring and atelectasis.   Upper Abdomen: No acute abnormality.   Musculoskeletal: No chest wall mass or suspicious bone lesions identified.   IMPRESSION: Trace left pleural effusion and/or pleural thickening with associated bandlike scarring and atelectasis. This is presumably postoperative following median sternotomy. No further follow-up is required.     Electronically Signed   By: Eddie Candle M.D.   On: 07/24/2020 15:27       PFT  No flowsheet data found.     has a past medical history of Hiatal hernia, Hypercholesteremia, Hypertension, Internal hemorrhoids, Migraines, MVP (mitral valve prolapse), and TIA (transient ischemic attack) (2012).   reports that he quit smoking about 45 years ago. His smoking use included cigarettes. He has a 7.00 pack-year smoking history. He has never used smokeless tobacco.  Past Surgical History:  Procedure Laterality Date   CARDIAC SURGERY  1960   Congenital heart defect   COLONOSCOPY     TEE WITHOUT CARDIOVERSION N/A 08/01/2014   Procedure: TRANSESOPHAGEAL ECHOCARDIOGRAM (TEE);  Surgeon: Sanda Klein, MD;  Location: [facility] ENDOSCOPY;  Service: Cardiovascular;  Laterality: N/A;    No Known Allergies  Immunization History  Administered Date(s) Administered   Influenza, High Dose Seasonal PF 10/17/2019   PFIZER(Purple Top)SARS-COV-2 Vaccination 04/11/2019, 05/11/2019   PNEUMOCOCCAL CONJUGATE-20 02/14/2020   Tdap 08/13/2012   Zoster Recombinat (Shingrix) 02/08/2017, 07/17/2017, 11/11/2017   Zoster, Live 11/30/2013    Family History  Problem Relation Age of Onset   Cancer Mother    Cancer Father        Limphoma   Heart disease Paternal Grandmother    Colon cancer Neg Hx    Esophageal cancer Neg Hx    Rectal cancer Neg Hx    Stomach cancer Neg Hx      Current Outpatient Medications:    acetaminophen (TYLENOL) 325 MG tablet, 1 tablet as needed, Disp: , Rfl:    aspirin 81 MG tablet, Take 81 mg by mouth daily.  , Disp: , Rfl:    Multiple Vitamin (MULTIVITAMIN) tablet, Take 1 tablet by mouth daily.  , Disp: , Rfl:    triamterene-hydrochlorothiazide (MAXZIDE-25) 37.5-25 MG tablet, Take 1 tablet by mouth daily., Disp: , Rfl:    zaleplon (SONATA) 10 MG  capsule, TAKE 1 CAPSULE BY MOUTH EVERY DAY AT BEDTIME AS NEEDED, Disp: , Rfl:       Objective:   There were no vitals filed for this visit.  Estimated body mass index is 26.29 kg/m as calculated from the following:   Height as of 07/18/20: '5\' 10"'$  (1.778 m).   Weight as of 07/18/20: 183 lb 3.2 oz (83.1 kg).  '@WEIGHTCHANGE'$ @  There were no vitals filed for this visit.   Physical Exam  Sounded normal on the phone      Assessment:       ICD-10-CM   1. Pleural thickening  J92.9     2. Personal history of COVID-19  Z86.16     3. Hiatal hernia  K44.9          Plan:     Patient Instructions     ICD-10-CM   1. Pleural thickening  J92.9     2. Personal history of COVID-19  Z86.16     3. Hiatal hernia  K44.9      CT scan has features of pleural thickening that presumably is from previous heart surgery  median sternotomy There is small hiatal hernia but understood from you that you only have occasional acid reflux There is no evidence of pulmonary fibrosis or lung cancer emphysema or pneumonia Overall from a respiratory standpoint he is asymptomatic  Follow-up - Expectant follow-up -Acid reflux per primary care physician   (Telephone visit - Level 02 visit: Estb 11-20 for this visit type which was visit type: telephone visit in total care time and counseling or/and coordination of care by this undersigned MD - Dr Brand Males. This includes one or more of the following for care delivered on 09/12/2020 same day: pre-charting, chart review, note writing, documentation discussion of test results, diagnostic or treatment recommendations, prognosis, risks and benefits of management options, instructions, education, compliance or risk-factor reduction. It excludes time spent by the [facility] or office staff in the care of the patient. Actual time was 14 min. E&M code is [phone])   SIGNATURE    Dr. Brand Males, M.D., F.C.C.P,  Pulmonary and Critical Care Medicine Staff Physician, [place] Director - Interstitial Lung Disease  Program  Pulmonary [place] at [place], [place], [address]  Pager: [phone], If no answer or between  15:00h - 7:00h: call [phone] Telephone: [phone]  9:47 AM 09/12/2020

## 2020-09-12 NOTE — Patient Instructions (Addendum)
ICD-10-CM   1. Pleural thickening  J92.9     2. Personal history of COVID-19  Z86.16     3. Hiatal hernia  K44.9      CT scan has features of pleural thickening that presumably is from previous heart surgery median sternotomy There is small hiatal hernia but understood from you that you only have occasional acid reflux There is no evidence of pulmonary fibrosis or lung cancer emphysema or pneumonia Overall from a respiratory standpoint he is asymptomatic  Follow-up - Expectant follow-up -Acid reflux per primary care physician

## 2021-02-14 DIAGNOSIS — I10 Essential (primary) hypertension: Secondary | ICD-10-CM | POA: Diagnosis not present

## 2021-02-19 DIAGNOSIS — I10 Essential (primary) hypertension: Secondary | ICD-10-CM | POA: Diagnosis not present

## 2021-02-19 DIAGNOSIS — D751 Secondary polycythemia: Secondary | ICD-10-CM | POA: Diagnosis not present

## 2021-02-19 DIAGNOSIS — Z1212 Encounter for screening for malignant neoplasm of rectum: Secondary | ICD-10-CM | POA: Diagnosis not present

## 2021-02-19 DIAGNOSIS — Z953 Presence of xenogenic heart valve: Secondary | ICD-10-CM | POA: Diagnosis not present

## 2021-02-19 DIAGNOSIS — Z8673 Personal history of transient ischemic attack (TIA), and cerebral infarction without residual deficits: Secondary | ICD-10-CM | POA: Diagnosis not present

## 2021-02-19 DIAGNOSIS — Z0000 Encounter for general adult medical examination without abnormal findings: Secondary | ICD-10-CM | POA: Diagnosis not present

## 2021-02-22 ENCOUNTER — Telehealth: Payer: Self-pay

## 2021-02-22 NOTE — Telephone Encounter (Signed)
NOTES SCANNED TO REFERRAL 

## 2021-03-28 DIAGNOSIS — D751 Secondary polycythemia: Secondary | ICD-10-CM | POA: Diagnosis not present

## 2021-03-28 DIAGNOSIS — I10 Essential (primary) hypertension: Secondary | ICD-10-CM | POA: Diagnosis not present

## 2021-03-28 DIAGNOSIS — Z953 Presence of xenogenic heart valve: Secondary | ICD-10-CM | POA: Diagnosis not present

## 2021-04-04 DIAGNOSIS — D751 Secondary polycythemia: Secondary | ICD-10-CM | POA: Diagnosis not present

## 2021-04-18 DIAGNOSIS — Z953 Presence of xenogenic heart valve: Secondary | ICD-10-CM | POA: Diagnosis not present

## 2021-04-18 DIAGNOSIS — D751 Secondary polycythemia: Secondary | ICD-10-CM | POA: Diagnosis not present

## 2021-04-18 DIAGNOSIS — I10 Essential (primary) hypertension: Secondary | ICD-10-CM | POA: Diagnosis not present

## 2021-10-24 DIAGNOSIS — S81812A Laceration without foreign body, left lower leg, initial encounter: Secondary | ICD-10-CM | POA: Diagnosis not present

## 2021-11-10 DIAGNOSIS — L03115 Cellulitis of right lower limb: Secondary | ICD-10-CM | POA: Diagnosis not present

## 2021-11-10 DIAGNOSIS — S81811D Laceration without foreign body, right lower leg, subsequent encounter: Secondary | ICD-10-CM | POA: Diagnosis not present

## 2022-01-10 DIAGNOSIS — I359 Nonrheumatic aortic valve disorder, unspecified: Secondary | ICD-10-CM | POA: Diagnosis not present

## 2022-01-10 DIAGNOSIS — Q211 Atrial septal defect, unspecified: Secondary | ICD-10-CM | POA: Diagnosis not present

## 2022-01-10 DIAGNOSIS — Z8673 Personal history of transient ischemic attack (TIA), and cerebral infarction without residual deficits: Secondary | ICD-10-CM | POA: Diagnosis not present

## 2022-01-10 DIAGNOSIS — Z8774 Personal history of (corrected) congenital malformations of heart and circulatory system: Secondary | ICD-10-CM | POA: Diagnosis not present

## 2022-01-10 DIAGNOSIS — Z8719 Personal history of other diseases of the digestive system: Secondary | ICD-10-CM | POA: Diagnosis not present

## 2022-01-10 DIAGNOSIS — Z953 Presence of xenogenic heart valve: Secondary | ICD-10-CM | POA: Diagnosis not present

## 2022-01-10 DIAGNOSIS — I371 Nonrheumatic pulmonary valve insufficiency: Secondary | ICD-10-CM | POA: Diagnosis not present

## 2022-04-22 DIAGNOSIS — I10 Essential (primary) hypertension: Secondary | ICD-10-CM | POA: Diagnosis not present

## 2022-04-29 DIAGNOSIS — D751 Secondary polycythemia: Secondary | ICD-10-CM | POA: Diagnosis not present

## 2022-04-29 DIAGNOSIS — Z9889 Other specified postprocedural states: Secondary | ICD-10-CM | POA: Diagnosis not present

## 2022-04-29 DIAGNOSIS — Z0000 Encounter for general adult medical examination without abnormal findings: Secondary | ICD-10-CM | POA: Diagnosis not present

## 2022-04-29 DIAGNOSIS — I8390 Asymptomatic varicose veins of unspecified lower extremity: Secondary | ICD-10-CM | POA: Diagnosis not present

## 2022-04-29 DIAGNOSIS — Z953 Presence of xenogenic heart valve: Secondary | ICD-10-CM | POA: Diagnosis not present

## 2022-04-29 DIAGNOSIS — I440 Atrioventricular block, first degree: Secondary | ICD-10-CM | POA: Diagnosis not present

## 2022-04-29 DIAGNOSIS — Z8616 Personal history of COVID-19: Secondary | ICD-10-CM | POA: Diagnosis not present

## 2022-04-29 DIAGNOSIS — J929 Pleural plaque without asbestos: Secondary | ICD-10-CM | POA: Diagnosis not present

## 2022-09-10 DIAGNOSIS — L821 Other seborrheic keratosis: Secondary | ICD-10-CM | POA: Diagnosis not present

## 2022-09-10 DIAGNOSIS — L815 Leukoderma, not elsewhere classified: Secondary | ICD-10-CM | POA: Diagnosis not present

## 2022-09-10 DIAGNOSIS — D225 Melanocytic nevi of trunk: Secondary | ICD-10-CM | POA: Diagnosis not present

## 2022-09-10 DIAGNOSIS — D224 Melanocytic nevi of scalp and neck: Secondary | ICD-10-CM | POA: Diagnosis not present

## 2022-09-10 DIAGNOSIS — Z129 Encounter for screening for malignant neoplasm, site unspecified: Secondary | ICD-10-CM | POA: Diagnosis not present

## 2022-09-10 DIAGNOSIS — D2239 Melanocytic nevi of other parts of face: Secondary | ICD-10-CM | POA: Diagnosis not present

## 2022-09-10 DIAGNOSIS — L718 Other rosacea: Secondary | ICD-10-CM | POA: Diagnosis not present

## 2023-01-09 DIAGNOSIS — I371 Nonrheumatic pulmonary valve insufficiency: Secondary | ICD-10-CM | POA: Diagnosis not present

## 2023-01-09 DIAGNOSIS — I517 Cardiomegaly: Secondary | ICD-10-CM | POA: Diagnosis not present

## 2023-01-09 DIAGNOSIS — Z953 Presence of xenogenic heart valve: Secondary | ICD-10-CM | POA: Diagnosis not present

## 2023-01-09 DIAGNOSIS — Z8774 Personal history of (corrected) congenital malformations of heart and circulatory system: Secondary | ICD-10-CM | POA: Diagnosis not present

## 2023-01-09 DIAGNOSIS — Q211 Atrial septal defect, unspecified: Secondary | ICD-10-CM | POA: Diagnosis not present

## 2023-04-30 DIAGNOSIS — I10 Essential (primary) hypertension: Secondary | ICD-10-CM | POA: Diagnosis not present

## 2023-05-05 DIAGNOSIS — Z952 Presence of prosthetic heart valve: Secondary | ICD-10-CM | POA: Diagnosis not present

## 2023-05-05 DIAGNOSIS — Z8774 Personal history of (corrected) congenital malformations of heart and circulatory system: Secondary | ICD-10-CM | POA: Diagnosis not present

## 2023-05-05 DIAGNOSIS — M720 Palmar fascial fibromatosis [Dupuytren]: Secondary | ICD-10-CM | POA: Diagnosis not present

## 2023-05-05 DIAGNOSIS — Z0000 Encounter for general adult medical examination without abnormal findings: Secondary | ICD-10-CM | POA: Diagnosis not present

## 2023-05-05 DIAGNOSIS — Z2989 Encounter for other specified prophylactic measures: Secondary | ICD-10-CM | POA: Diagnosis not present

## 2023-05-05 DIAGNOSIS — D751 Secondary polycythemia: Secondary | ICD-10-CM | POA: Diagnosis not present

## 2023-05-05 DIAGNOSIS — H9311 Tinnitus, right ear: Secondary | ICD-10-CM | POA: Diagnosis not present
